# Patient Record
Sex: Male | Born: 1965
Health system: Southern US, Community
[De-identification: ages and names within clinical notes are randomized; demographics above are authoritative.]

## PROBLEM LIST (undated history)

## (undated) DIAGNOSIS — I10 Essential (primary) hypertension: Secondary | ICD-10-CM

## (undated) DIAGNOSIS — G5680 Other specified mononeuropathies of unspecified upper limb: Secondary | ICD-10-CM

## (undated) DIAGNOSIS — G588 Other specified mononeuropathies: Secondary | ICD-10-CM

## (undated) DIAGNOSIS — E785 Hyperlipidemia, unspecified: Secondary | ICD-10-CM

## (undated) DIAGNOSIS — F419 Anxiety disorder, unspecified: Secondary | ICD-10-CM

## (undated) DIAGNOSIS — K469 Unspecified abdominal hernia without obstruction or gangrene: Secondary | ICD-10-CM

## (undated) DIAGNOSIS — Z87442 Personal history of urinary calculi: Secondary | ICD-10-CM

## (undated) HISTORY — PX: LITHOTRIPSY: SUR834

## (undated) HISTORY — DX: Hyperlipidemia, unspecified: E78.5

## (undated) HISTORY — PX: CYSTOSTOMY W/ STENT INSERTION: SHX1434

## (undated) HISTORY — DX: Unspecified abdominal hernia without obstruction or gangrene: K46.9

## (undated) HISTORY — DX: Essential (primary) hypertension: I10

---

## 1997-10-30 ENCOUNTER — Other Ambulatory Visit: Admission: RE | Admit: 1997-10-30 | Discharge: 1997-10-30 | Payer: Self-pay | Admitting: Dermatology

## 2006-10-16 ENCOUNTER — Emergency Department (HOSPITAL_COMMUNITY): Admission: EM | Admit: 2006-10-16 | Discharge: 2006-10-16 | Payer: Self-pay | Admitting: Emergency Medicine

## 2006-10-16 ENCOUNTER — Emergency Department (HOSPITAL_COMMUNITY): Admission: EM | Admit: 2006-10-16 | Discharge: 2006-10-17 | Payer: Self-pay | Admitting: Emergency Medicine

## 2006-10-20 ENCOUNTER — Ambulatory Visit (HOSPITAL_COMMUNITY): Admission: RE | Admit: 2006-10-20 | Discharge: 2006-10-20 | Payer: Self-pay | Admitting: Urology

## 2006-10-24 ENCOUNTER — Ambulatory Visit (HOSPITAL_COMMUNITY): Admission: RE | Admit: 2006-10-24 | Discharge: 2006-10-24 | Payer: Self-pay | Admitting: Urology

## 2009-05-24 HISTORY — PX: HERNIA REPAIR: SHX51

## 2009-12-12 ENCOUNTER — Ambulatory Visit (HOSPITAL_BASED_OUTPATIENT_CLINIC_OR_DEPARTMENT_OTHER): Admission: RE | Admit: 2009-12-12 | Discharge: 2009-12-12 | Payer: Self-pay | Admitting: General Surgery

## 2010-08-08 LAB — BASIC METABOLIC PANEL
Chloride: 102 mEq/L (ref 96–112)
GFR calc Af Amer: >60 mL/min (ref >60)
GFR calc non Af Amer: >60 mL/min (ref >60)
Potassium: 4.6 mEq/L (ref 3.5–5.1)
Sodium: 137 mEq/L (ref 135–145)

## 2010-08-08 LAB — POCT HEMOGLOBIN-HEMACUE: Hemoglobin: 16.4 g/dL (ref 13.0–17.0)

## 2010-10-06 NOTE — H&P (Signed)
NAME:  Edwin Lucas, Edwin Lucas NO.:  [id number]   MEDICAL RECORD NO.:  [id number]          PATIENT TYPE:  EMS   LOCATION:  ED                           FACILITY:  [facility]   PHYSICIAN:  Courtney Paris, M.D.DATE OF BIRTH:  1966/03/21   DATE OF ADMISSION:  10/16/2006  DATE OF DISCHARGE:                              HISTORY & PHYSICAL   HISTORY OF PRESENT ILLNESS:  This 45 year old white male was admitted  with acute right flank pain of early 24 hours in duration.  It woke him  up out of a sleep at 1 a.m. earlier this morning.  It is now nearly  midnight.  He went to the emergency room and then was stabilized with  pain medicine but pain came back and pain meds were doing absolutely no  good.  He has had no previous stones.  KUB shows stone had not changed  from the UPJ where was it located earlier today.  Only operation was  wisdom teeth removed many years ago.   ALLERGIES:  He has no allergies.   MEDICATIONS:  Takes no regular medication.   He saw me about eight years ago for a kidney infection.  but  he had  an x-ray which apparently did not show a stone.   REVIEW OF SYSTEMS:  Twelve-point review of systems otherwise negative.   SOCIAL HISTORY:  He is married, has three children, ages 50 to 70.  He is  self-employed Medical illustrator.  Works out of his home.  He does not smoke.  No  cardiac symptoms.  No pulmonary symptomatology.   PHYSICAL EXAMINATION:  VITAL SIGNS:  Temperature is 98.5, pulse 98,  respirations 18, blood pressure 137/100.  He is a healthy-appearing  white male but in acute pain from right flank discomfort.  HEENT: Clear.  NECK:  Supple.  LUNGS:  Clear.  ABDOMEN:  Soft.  Liver and spleen nonpalpable.  Bladder is not  distended.  GU:  Penis normal, circumcised, adequate meatus, bilaterally descended  testes.  Prostate small, benign.  EXTREMITIES:  Negative.  No edema.  Good distal pulses.  Does have  positive right CVA tenderness.   LABORATORY DATA:   X-ray showed 5 mm stone at the proximal right ureter  as it was earlier today.   IMPRESSION:  1. Obstructing right proximal ureteral stone.  2. Microscopic hematuria.  3. Right flank pain.  Recommend system retrograde and stent placement      tonight as an emergency.      Courtney Paris, M.D.  Electronically Signed     HMK/MEDQ  D:  10/16/2006  T:  10/17/2006  Job:  [id number]

## 2010-10-06 NOTE — Op Note (Signed)
NAME:  Edwin Lucas, Edwin Lucas NO.:  [id number]   MEDICAL RECORD NO.:  [id number]          PATIENT TYPE:  EMS   LOCATION:                               FACILITY:  [facility]   PHYSICIAN:  Courtney Paris, M.D.DATE OF BIRTH:  [birth year]   DATE OF PROCEDURE:  10/17/2006  DATE OF DISCHARGE:                               OPERATIVE REPORT   PREOPERATIVE DIAGNOSIS:  Right ureteropelvic junction stone with  obstruction.   POSTOPERATIVE DIAGNOSIS:  Right ureteropelvic junction stone with  obstruction plus urethral stricture.   PROCEDURE:  1. Cystoscopy.  2. Urethral dilation.  3. Right retrograde pyelogram.  4. Right ureteral stent placement.   ANESTHESIA:  General.   SURGEON:  Courtney Paris, M.D.   BRIEF HISTORY:  This 45 year old patient presents with his first stone  about 24 hours ago.  He has had 2 visits to the emergency room for pain.  The stone has not moved.  It is a 5- to 6-mm stone in the proximal right  ureter.  He enters to have a stent placed at this time for pain relief.   The patient was placed on the operating table in the dorsal lithotomy  position after satisfactory induction of general anesthesia and was  prepped and draped with Betadine in the usual sterile fashion and given  IV Cipro.  The panendoscope was passed down the urethra.  He did have a  circumferential scar in the deep bulbous urethra that was negotiated by  the scope and the prostate was nonobstructing.  The bladder was entered.  The bladder was free of any mucosal lesions.  The right ureteral orifice  was catheterized with a 6 open-ended ureteral catheter, but I had to  place a Sensor guidewire through the end of the catheter to get this  into the small ureteral opening.  When this was done, the guidewire was  removed and an occlusive retrograde demonstrated the stone in the  proximal ureter, with the distal ureter normal.  The stone was  manipulated back into the renal pelvis  with the flushing of the dye.  Next, the Sensor guidewire was then passed back through the open-ended  catheter up to the level of the kidney under fluoroscopy as the open-  ended catheter was then removed.  The patient was 6-feet 2-inches tall,  so I used a 6 x 28-cm-length double-J ureteral stent, but this seemed to  be too long; he has a short trunk and I had to remove the stent and  place the guide wire again and this time a 6 x 26 ureteral stent fit  nicely with a coil in the renal pelvis, with a stone back in the renal  pelvis and one in the bladder in a satisfactory manner.  The bladder was  drained and scope  removed.  The stricture looked adequately dilated.  He was given some  Toradol and B&O suppository for pain relief and was taken to the  recovery room in good condition and will be later discharged as an  outpatient.  He will have lithotripsy later next week.  Courtney Paris, M.D.  Electronically Signed     HMK/MEDQ  D:  10/17/2006  T:  10/17/2006  Job:  [id number]

## 2012-02-16 ENCOUNTER — Encounter (INDEPENDENT_AMBULATORY_CARE_PROVIDER_SITE_OTHER): Payer: Self-pay | Admitting: General Surgery

## 2012-02-16 ENCOUNTER — Ambulatory Visit (INDEPENDENT_AMBULATORY_CARE_PROVIDER_SITE_OTHER): Payer: BC Managed Care – PPO | Admitting: General Surgery

## 2012-02-16 VITALS — BP 130/82 | HR 78 | Temp 98.2°F | Resp 18 | Ht 74.5 in | Wt 273.4 lb

## 2012-02-16 DIAGNOSIS — K439 Ventral hernia without obstruction or gangrene: Secondary | ICD-10-CM | POA: Insufficient documentation

## 2012-02-16 NOTE — Progress Notes (Signed)
Patient ID: Edwin Lucas, male   DOB: January 19, 1966, 46 y.o.   MRN: [id number]  Chief Complaint  Patient presents with  . Hernia    Abdominal wall    HPI Edwin Lucas is a 46 y.o. male.  Possible ventral hernia HPI  Patient status post umbilical hernia repair with mesh in 2011. During his postoperative period, he lifted some heavy laundry in a basket and had some midline pain. He was seen by my partner in the office and was felt to have a subcutaneous mass several centimeters above the umbilicus. His umbilical hernia repair seemed intact at that time. He was thought to possibly have an epigastric hernia versus a subcutaneous mass. It did not bother him at that time. Recently, however, he's been having increasing pain at the site. The mass is there all the time. It never goes in and out. He is not having any changes in bowel or bladder habits. Past Medical History  Diagnosis Date  . Hypertension   . Hernia     abdominal wall    Past Surgical History  Procedure Date  . Hernia repair 2011    umbilical    Family History  Problem Relation Age of Onset  . Cancer Mother     breast  . Heart disease Father     CHF    Social History History  Substance Use Topics  . Smoking status: Former Smoker    Quit date: 02/16/1992  . Smokeless tobacco: Never Used  . Alcohol Use: 1.2 oz/week    2 Glasses of wine per week     every evening    No Known Allergies  Current Outpatient Prescriptions  Medication Sig Dispense Refill  . lisinopril (PRINIVIL,ZESTRIL) 20 MG tablet Daily.        Review of Systems Review of Systems  Constitutional: Negative for fever, chills and unexpected weight change.  HENT: Negative for hearing loss, congestion, sore throat, trouble swallowing and voice change.   Eyes: Negative for visual disturbance.  Respiratory: Negative for cough and wheezing.   Cardiovascular: Negative for chest pain, palpitations and leg swelling.  Gastrointestinal: Positive for  abdominal pain. Negative for nausea, vomiting, diarrhea, constipation, blood in stool, abdominal distention, anal bleeding and rectal pain.       See history of present illness  Genitourinary: Negative for hematuria and difficulty urinating.  Musculoskeletal: Negative for arthralgias.  Skin: Negative for rash and wound.  Neurological: Negative for seizures, syncope, weakness and headaches.  Hematological: Negative for adenopathy. Does not bruise/bleed easily.  Psychiatric/Behavioral: Negative for confusion.    There were no vitals taken for this visit.  Physical Exam Physical Exam  Constitutional: He is oriented to person, place, and time. He appears well-developed and well-nourished.  HENT:  Head: Normocephalic and atraumatic.  Eyes: EOM are normal. Pupils are equal, round, and reactive to light.  Neck: Normal range of motion. Neck supple.  Cardiovascular: Normal rate, regular rhythm and normal heart sounds.   Pulmonary/Chest: Effort normal and breath sounds normal. No respiratory distress. He has no wheezes.  Abdominal: Soft. He exhibits no distension. There is no tenderness. There is no rebound and no guarding.         4-5 cm subcutaneous mass 3 cm cephalad to the umbilicus. It does not reduce like a usual ventral hernia however the location is typical for that. Umbilical hernia repair is intact  Musculoskeletal: Normal range of motion.  Neurological: He is alert and oriented to person, place, and time.  Skin: Skin is warm and dry.    Data Reviewed   Assessment    Possible primary ventral hernia versus subcutaneous abdominal wall mass    Plan    Will check CT scan of the abdomen and pelvis. If this is a ventral hernia, we'll plan laparoscopic repair with mesh. This procedure was discussed in detail with the patient. If it is a subcutaneous mass, we'll also plan excision. It is symptomatic. Will contact patient once we have the results from the scan. I will plan to see him  back next week as well.       Ruby Logiudice E 02/16/2012, 9:49 AM

## 2012-02-17 ENCOUNTER — Other Ambulatory Visit: Payer: Self-pay

## 2012-02-21 ENCOUNTER — Telehealth: Payer: Self-pay | Admitting: General Surgery

## 2012-02-21 ENCOUNTER — Ambulatory Visit
Admission: RE | Admit: 2012-02-21 | Discharge: 2012-02-21 | Disposition: A | Discharge status: Home / Self Care | Payer: BC Managed Care – PPO | Source: Ambulatory Visit | Attending: General Surgery | Admitting: General Surgery

## 2012-02-21 DIAGNOSIS — K439 Ventral hernia without obstruction or gangrene: Secondary | ICD-10-CM

## 2012-02-21 MED ORDER — IOHEXOL 300 MG/ML  SOLN
125.0000 mL | Freq: Once | INTRAMUSCULAR | Status: AC | PRN
  Administered 2012-02-21: 125 mL via INTRAVENOUS

## 2012-02-21 NOTE — Telephone Encounter (Signed)
Called patient to discuss results of his CT.  Left message

## 2012-02-22 ENCOUNTER — Telehealth: Payer: Self-pay | Admitting: General Surgery

## 2012-02-22 ENCOUNTER — Other Ambulatory Visit: Payer: Self-pay | Admitting: General Surgery

## 2012-02-22 NOTE — Telephone Encounter (Signed)
I discussed patient's finding on CT of ventral hernia.  As we spoke in the office, I offered laparoscopic repair with mesh.  We discussed the procedure and its risks and benefits.  He agrees and will call back to schedule.

## 2012-05-12 ENCOUNTER — Encounter (HOSPITAL_COMMUNITY): Admission: RE | Payer: Self-pay | Source: Ambulatory Visit

## 2012-05-12 ENCOUNTER — Ambulatory Visit (HOSPITAL_COMMUNITY): Admission: RE | Admit: 2012-05-12 | Payer: BC Managed Care – PPO | Source: Ambulatory Visit | Admitting: General Surgery

## 2012-05-12 SURGERY — REPAIR, HERNIA, VENTRAL, LAPAROSCOPIC
Anesthesia: General

## 2013-02-21 ENCOUNTER — Ambulatory Visit (INDEPENDENT_AMBULATORY_CARE_PROVIDER_SITE_OTHER): Payer: BC Managed Care – PPO | Admitting: General Surgery

## 2013-02-21 ENCOUNTER — Encounter (INDEPENDENT_AMBULATORY_CARE_PROVIDER_SITE_OTHER): Payer: Self-pay | Admitting: General Surgery

## 2013-02-21 VITALS — BP 123/80 | HR 77 | Temp 98.2°F | Resp 16 | Ht 74.5 in | Wt 274.6 lb

## 2013-02-21 DIAGNOSIS — K439 Ventral hernia without obstruction or gangrene: Secondary | ICD-10-CM

## 2013-02-21 NOTE — Progress Notes (Signed)
Patient ID: Edwin Lucas, male   DOB: 05/23/1966, 47 y.o.   MRN: [id number]  Chief Complaint  Patient presents with  . Hernia    HPI Edwin Lucas is a 47 y.o. male.  Chief complaint: Ventral hernia HPI The patient was scheduled for laparoscopic repair ventral hernia last December. He said cancel at that time due to a variety of reasons. Since then, the hernia has gradually gotten larger. He is not having significant pain but he does have discomfort, especially with activity. He is having no bowel complaints. The hernia seems to go in and out at least partially by itself. Past Medical History  Diagnosis Date  . Hypertension   . Hernia     abdominal wall  . Hyperlipidemia     Past Surgical History  Procedure Laterality Date  . Hernia repair  2011    umbilical    Family History  Problem Relation Age of Onset  . Cancer Mother     breast  . Heart disease Father     CHF    Social History History  Substance Use Topics  . Smoking status: Former Smoker    Quit date: 02/16/1992  . Smokeless tobacco: Never Used  . Alcohol Use: 1.2 oz/week    2 Glasses of wine per week     Comment: every evening    No Known Allergies  Current Outpatient Prescriptions  Medication Sig Dispense Refill  . lisinopril (PRINIVIL,ZESTRIL) 20 MG tablet Daily.       No current facility-administered medications for this visit.    Review of Systems Review of Systems  Constitutional: Negative for fever, chills and unexpected weight change.  HENT: Negative for hearing loss, congestion, sore throat, trouble swallowing and voice change.   Eyes: Negative for visual disturbance.  Respiratory: Negative for cough and wheezing.   Cardiovascular: Negative for chest pain, palpitations and leg swelling.  Gastrointestinal: Negative for nausea, vomiting, abdominal pain, diarrhea, constipation, blood in stool, abdominal distention, anal bleeding and rectal pain.       See history of present illness    Genitourinary: Negative for hematuria and difficulty urinating.  Musculoskeletal: Negative for arthralgias.  Skin: Negative for rash and wound.  Neurological: Negative for seizures, syncope, weakness and headaches.  Hematological: Negative for adenopathy. Does not bruise/bleed easily.  Psychiatric/Behavioral: Negative for confusion.    Blood pressure 123/80, pulse 77, temperature 98.2 F (36.8 C), temperature source Temporal, resp. rate 16, height 6' 2.5" (1.892 m), weight 274 lb 9.6 oz (124.558 kg).  Physical Exam Physical Exam  Constitutional: He is oriented to person, place, and time. He appears well-developed and well-nourished.  HENT:  Head: Normocephalic and atraumatic.  Mouth/Throat: No oropharyngeal exudate.  Eyes: EOM are normal. Pupils are equal, round, and reactive to light. No scleral icterus.  Neck: Normal range of motion. No tracheal deviation present.  Cardiovascular: Normal rate, normal heart sounds and intact distal pulses.   Pulmonary/Chest: Effort normal and breath sounds normal. No stridor. No respiratory distress. He has no wheezes. He has no rales.  Abdominal: Soft. He exhibits no distension. There is no tenderness. There is no rebound.    Supraumbilical ventral hernia partly reduces, no tenderness  Musculoskeletal: He exhibits no edema and no tenderness.  Neurological: He is alert and oriented to person, place, and time.  Skin: Skin is warm and dry.  Psychiatric: He has a normal mood and affect.   Assessment    Symptomatic ventral hernia    Plan  I've offered laparoscopic repair of ventral hernia with mesh. Procedure, risks, and benefits were again discussed with the patient. Rest discussed the possibility of need for conversion to open procedure. He is agreeable. We will plan to schedule this in the near future with overnight observation.       Dariela Stoker E 02/21/2013, 10:53 AM

## 2013-03-09 ENCOUNTER — Ambulatory Visit (HOSPITAL_COMMUNITY)
Admission: RE | Admit: 2013-03-09 | Discharge: 2013-03-09 | Disposition: A | Discharge status: Home / Self Care | Payer: BC Managed Care – PPO | Source: Ambulatory Visit | Attending: Anesthesiology | Admitting: Anesthesiology

## 2013-03-09 ENCOUNTER — Encounter (HOSPITAL_COMMUNITY): Payer: Self-pay

## 2013-03-09 ENCOUNTER — Encounter (HOSPITAL_COMMUNITY)
Admission: RE | Admit: 2013-03-09 | Discharge: 2013-03-09 | Disposition: A | Discharge status: Home / Self Care | Payer: BC Managed Care – PPO | Source: Ambulatory Visit | Attending: General Surgery | Admitting: General Surgery

## 2013-03-09 DIAGNOSIS — Z01818 Encounter for other preprocedural examination: Secondary | ICD-10-CM | POA: Insufficient documentation

## 2013-03-09 DIAGNOSIS — Z01810 Encounter for preprocedural cardiovascular examination: Secondary | ICD-10-CM | POA: Insufficient documentation

## 2013-03-09 DIAGNOSIS — I10 Essential (primary) hypertension: Secondary | ICD-10-CM | POA: Insufficient documentation

## 2013-03-09 DIAGNOSIS — Z01812 Encounter for preprocedural laboratory examination: Secondary | ICD-10-CM | POA: Insufficient documentation

## 2013-03-09 DIAGNOSIS — R9431 Abnormal electrocardiogram [ECG] [EKG]: Secondary | ICD-10-CM | POA: Insufficient documentation

## 2013-03-09 HISTORY — DX: Personal history of urinary calculi: Z87.442

## 2013-03-09 HISTORY — DX: Other specified mononeuropathies of unspecified upper limb: G56.80

## 2013-03-09 HISTORY — DX: Anxiety disorder, unspecified: F41.9

## 2013-03-09 HISTORY — DX: Other specified mononeuropathies: G58.8

## 2013-03-09 LAB — CBC
HCT: 44.7 % (ref 39.0–52.0)
Hemoglobin: 16.1 g/dL (ref 13.0–17.0)
MCHC: 36 g/dL (ref 30.0–36.0)
MCV: 88.9 fL (ref 78.0–100.0)
Platelets: 220 10*3/uL (ref 150–400)
RBC: 5.03 MIL/uL (ref 4.22–5.81)
WBC: 5.7 10*3/uL (ref 4.0–10.5)

## 2013-03-09 LAB — COMPREHENSIVE METABOLIC PANEL
Albumin: 4.2 g/dL (ref 3.5–5.2)
BUN: 12 mg/dL (ref 6–23)
Calcium: 9.9 mg/dL (ref 8.4–10.5)
Creatinine, Ser: 0.82 mg/dL (ref 0.50–1.35)
Total Protein: 7.3 g/dL (ref 6.0–8.3)

## 2013-03-09 NOTE — Pre-Procedure Instructions (Signed)
Edwin Lucas  03/09/2013   Your procedure is scheduled on:  October 23  Report to [facility] Entrance "A" [address] at [employer] AM.  Call this number if you have problems the morning of surgery: [phone]   Remember:   Do not eat food or drink liquids after midnight.   Take these medicines the morning of surgery with A SIP OF WATER: Effexor   STOP Excedrin and Ibuprofen today  Do not take Aspirin, Aleve, Naproxen, Advil, Ibuprofen, Vitamin, Herbs, or Supplements starting today  Do not wear jewelry, make-up or nail polish.  Do not wear lotions, powders, or perfumes. You may wear deodorant.  Do not shave 48 hours prior to surgery. Men may shave face and neck.  Do not bring valuables to the hospital.  [facility] is not responsible                  for any belongings or valuables.               Contacts, dentures or bridgework may not be worn into surgery.  Leave suitcase in the car. After surgery it may be brought to your room.  For patients admitted to the hospital, discharge time is determined by your                treatment team.               Patients discharged the day of surgery will not be allowed to drive  home.  Name and phone number of your driver: Family/ Friend  Special Instructions: Shower using CHG 2 nights before surgery and the night before surgery.  If you shower the day of surgery use CHG.  Use special wash - you have one bottle of CHG for all showers.  You should use approximately 1/3 of the bottle for each shower.   Please read over the following fact sheets that you were given: Pain Booklet, Coughing and Deep Breathing and Surgical Site Infection Prevention

## 2013-03-09 NOTE — Progress Notes (Signed)
03/09/13 1533  OBSTRUCTIVE SLEEP APNEA  Have you ever been diagnosed with sleep apnea through a sleep study? No  Do you snore loudly (loud enough to be heard through closed doors)?  1  Do you often feel tired, fatigued, or sleepy during the daytime? 0  Has anyone observed you stop breathing during your sleep? 1  Do you have, or are you being treated for high blood pressure? 1  BMI more than 35 kg/m2? 0  Age over 47 years old? 0  Neck circumference greater than 40 cm/18 inches? 0  Gender: 1  Obstructive Sleep Apnea Score 4  Score 4 or greater  Results sent to PCP

## 2013-03-13 ENCOUNTER — Encounter (HOSPITAL_COMMUNITY): Payer: Self-pay | Admitting: Pharmacist

## 2013-03-14 MED ORDER — DEXTROSE 5 % IV SOLN
3.0000 g | INTRAVENOUS | Status: AC
  Administered 2013-03-15: 3 g via INTRAVENOUS
  Filled 2013-03-14: qty 3000

## 2013-03-15 ENCOUNTER — Observation Stay (HOSPITAL_COMMUNITY)
Admission: RE | Admit: 2013-03-15 | Discharge: 2013-03-16 | Disposition: A | Discharge status: Home / Self Care | Payer: BC Managed Care – PPO | Source: Ambulatory Visit | Attending: General Surgery | Admitting: General Surgery

## 2013-03-15 ENCOUNTER — Encounter (HOSPITAL_COMMUNITY): Payer: BC Managed Care – PPO | Admitting: Anesthesiology

## 2013-03-15 ENCOUNTER — Encounter (HOSPITAL_COMMUNITY): Payer: Self-pay | Admitting: Anesthesiology

## 2013-03-15 ENCOUNTER — Encounter (HOSPITAL_COMMUNITY)
Admission: RE | Disposition: A | Discharge status: Home / Self Care | Payer: Self-pay | Source: Ambulatory Visit | Attending: General Surgery

## 2013-03-15 ENCOUNTER — Ambulatory Visit (HOSPITAL_COMMUNITY): Payer: BC Managed Care – PPO | Admitting: Anesthesiology

## 2013-03-15 DIAGNOSIS — E785 Hyperlipidemia, unspecified: Secondary | ICD-10-CM | POA: Insufficient documentation

## 2013-03-15 DIAGNOSIS — I10 Essential (primary) hypertension: Secondary | ICD-10-CM | POA: Insufficient documentation

## 2013-03-15 DIAGNOSIS — K439 Ventral hernia without obstruction or gangrene: Principal | ICD-10-CM | POA: Insufficient documentation

## 2013-03-15 HISTORY — PX: INSERTION OF MESH: SHX5868

## 2013-03-15 HISTORY — PX: VENTRAL HERNIA REPAIR: SHX424

## 2013-03-15 SURGERY — REPAIR, HERNIA, VENTRAL, LAPAROSCOPIC
Anesthesia: General | Site: Abdomen | Wound class: Clean

## 2013-03-15 MED ORDER — MIDAZOLAM HCL 5 MG/5ML IJ SOLN
INTRAMUSCULAR | Status: DC | PRN
  Administered 2013-03-15: 2 mg via INTRAVENOUS

## 2013-03-15 MED ORDER — ONDANSETRON HCL 4 MG/2ML IJ SOLN
INTRAMUSCULAR | Status: DC | PRN
  Administered 2013-03-15: 4 mg via INTRAVENOUS

## 2013-03-15 MED ORDER — ATORVASTATIN CALCIUM 20 MG PO TABS
20.0000 mg | ORAL_TABLET | Freq: Every day | ORAL | Status: DC
  Administered 2013-03-15: 20 mg via ORAL
  Filled 2013-03-15 (×2): qty 1

## 2013-03-15 MED ORDER — HEPARIN SODIUM (PORCINE) 5000 UNIT/ML IJ SOLN
5000.0000 [IU] | Freq: Three times a day (TID) | INTRAMUSCULAR | Status: DC
  Administered 2013-03-15 – 2013-03-16 (×2): 5000 [IU] via SUBCUTANEOUS
  Filled 2013-03-15 (×4): qty 1

## 2013-03-15 MED ORDER — GLYCOPYRROLATE 0.2 MG/ML IJ SOLN
INTRAMUSCULAR | Status: DC | PRN
  Administered 2013-03-15: .5 mg via INTRAVENOUS

## 2013-03-15 MED ORDER — LACTATED RINGERS IV SOLN
INTRAVENOUS | Status: DC | PRN
  Administered 2013-03-15 (×3): via INTRAVENOUS

## 2013-03-15 MED ORDER — HYDROCODONE-ACETAMINOPHEN 10-325 MG PO TABS
1.0000 | ORAL_TABLET | ORAL | Status: DC | PRN
  Administered 2013-03-16 (×2): 2 via ORAL
  Filled 2013-03-15 (×2): qty 2

## 2013-03-15 MED ORDER — HYDROMORPHONE HCL PF 1 MG/ML IJ SOLN
0.2500 mg | INTRAMUSCULAR | Status: DC | PRN
  Administered 2013-03-15 (×3): 0.5 mg via INTRAVENOUS

## 2013-03-15 MED ORDER — IBUPROFEN 600 MG PO TABS: 600.0000 mg | ORAL_TABLET | Freq: Three times a day (TID) | ORAL | Status: DC | PRN

## 2013-03-15 MED ORDER — KETOROLAC TROMETHAMINE 30 MG/ML IJ SOLN
15.0000 mg | Freq: Once | INTRAMUSCULAR | Status: AC | PRN
  Administered 2013-03-15: 30 mg via INTRAVENOUS

## 2013-03-15 MED ORDER — ONDANSETRON HCL 4 MG PO TABS: 4.0000 mg | ORAL_TABLET | Freq: Four times a day (QID) | ORAL | Status: DC | PRN

## 2013-03-15 MED ORDER — ACETAMINOPHEN 325 MG PO TABS: 650.0000 mg | ORAL_TABLET | ORAL | Status: DC | PRN

## 2013-03-15 MED ORDER — HYDROMORPHONE HCL PF 1 MG/ML IJ SOLN
INTRAMUSCULAR | Status: AC
  Filled 2013-03-15: qty 1

## 2013-03-15 MED ORDER — LISINOPRIL 20 MG PO TABS
20.0000 mg | ORAL_TABLET | Freq: Every morning | ORAL | Status: DC
  Filled 2013-03-15: qty 1

## 2013-03-15 MED ORDER — VENLAFAXINE HCL ER 150 MG PO CP24
150.0000 mg | ORAL_CAPSULE | Freq: Every day | ORAL | Status: DC
  Filled 2013-03-15: qty 1

## 2013-03-15 MED ORDER — FENTANYL CITRATE 0.05 MG/ML IJ SOLN
INTRAMUSCULAR | Status: DC | PRN
  Administered 2013-03-15 (×2): 50 ug via INTRAVENOUS
  Administered 2013-03-15: 100 ug via INTRAVENOUS
  Administered 2013-03-15: 50 ug via INTRAVENOUS
  Administered 2013-03-15: 100 ug via INTRAVENOUS
  Administered 2013-03-15 (×3): 50 ug via INTRAVENOUS

## 2013-03-15 MED ORDER — LIDOCAINE HCL (CARDIAC) 20 MG/ML IV SOLN
INTRAVENOUS | Status: DC | PRN
  Administered 2013-03-15: 80 mg via INTRAVENOUS

## 2013-03-15 MED ORDER — HYDROMORPHONE HCL PF 1 MG/ML IJ SOLN
1.0000 mg | INTRAMUSCULAR | Status: DC | PRN
  Administered 2013-03-15 (×2): 1 mg via INTRAVENOUS
  Filled 2013-03-15 (×3): qty 1

## 2013-03-15 MED ORDER — NEOSTIGMINE METHYLSULFATE 1 MG/ML IJ SOLN
INTRAMUSCULAR | Status: DC | PRN
  Administered 2013-03-15: 4 mg via INTRAVENOUS

## 2013-03-15 MED ORDER — ROCURONIUM BROMIDE 100 MG/10ML IV SOLN
INTRAVENOUS | Status: DC | PRN
  Administered 2013-03-15: 50 mg via INTRAVENOUS

## 2013-03-15 MED ORDER — PROPOFOL 10 MG/ML IV BOLUS
INTRAVENOUS | Status: DC | PRN
  Administered 2013-03-15: 300 mg via INTRAVENOUS

## 2013-03-15 MED ORDER — ONDANSETRON HCL 4 MG/2ML IJ SOLN: 4.0000 mg | Freq: Once | INTRAMUSCULAR | Status: DC | PRN

## 2013-03-15 MED ORDER — ONDANSETRON HCL 4 MG/2ML IJ SOLN: 4.0000 mg | Freq: Four times a day (QID) | INTRAMUSCULAR | Status: DC | PRN

## 2013-03-15 MED ORDER — BUPIVACAINE-EPINEPHRINE (PF) 0.5% -1:200000 IJ SOLN
INTRAMUSCULAR | Status: AC
  Filled 2013-03-15: qty 10

## 2013-03-15 MED ORDER — KCL IN DEXTROSE-NACL 20-5-0.45 MEQ/L-%-% IV SOLN
INTRAVENOUS | Status: DC
  Administered 2013-03-15: 13:00:00 via INTRAVENOUS
  Administered 2013-03-16: 75 mL/h via INTRAVENOUS
  Filled 2013-03-15 (×3): qty 1000

## 2013-03-15 MED ORDER — KETOROLAC TROMETHAMINE 30 MG/ML IJ SOLN
INTRAMUSCULAR | Status: AC
  Filled 2013-03-15: qty 1

## 2013-03-15 MED ORDER — BUPIVACAINE-EPINEPHRINE 0.5% -1:200000 IJ SOLN
INTRAMUSCULAR | Status: DC | PRN
  Administered 2013-03-15: 19 mL

## 2013-03-15 MED ORDER — ACETAMINOPHEN 10 MG/ML IV SOLN
1000.0000 mg | INTRAVENOUS | Status: AC
  Administered 2013-03-15: 1000 mg via INTRAVENOUS
  Filled 2013-03-15: qty 100

## 2013-03-15 MED ORDER — SODIUM CHLORIDE 0.9 % IR SOLN
Status: DC | PRN
  Administered 2013-03-15: 1

## 2013-03-15 MED ORDER — CHLORHEXIDINE GLUCONATE 4 % EX LIQD: 1.0000 "application " | Freq: Once | CUTANEOUS | Status: DC

## 2013-03-15 MED ORDER — DEXAMETHASONE SODIUM PHOSPHATE 4 MG/ML IJ SOLN
INTRAMUSCULAR | Status: DC | PRN
  Administered 2013-03-15: 4 mg via INTRAVENOUS

## 2013-03-15 MED ORDER — VECURONIUM BROMIDE 10 MG IV SOLR
INTRAVENOUS | Status: DC | PRN
  Administered 2013-03-15 (×2): 1 mg via INTRAVENOUS
  Administered 2013-03-15: 3 mg via INTRAVENOUS
  Administered 2013-03-15 (×2): 2 mg via INTRAVENOUS
  Administered 2013-03-15: 1 mg via INTRAVENOUS

## 2013-03-15 MED ORDER — 0.9 % SODIUM CHLORIDE (POUR BTL) OPTIME
TOPICAL | Status: DC | PRN
  Administered 2013-03-15: 1000 mL

## 2013-03-15 SURGICAL SUPPLY — 56 items
ADH SKN CLS APL DERMABOND .7 (GAUZE/BANDAGES/DRESSINGS) ×1
APPLIER CLIP 5 13 M/L LIGAMAX5 (MISCELLANEOUS)
APPLIER CLIP ROT 10 11.4 M/L (STAPLE)
APR CLP MED LRG 11.4X10 (STAPLE)
APR CLP MED LRG 5 ANG JAW (MISCELLANEOUS)
BINDER ABD UNIV 12 30-45 (MISCELLANEOUS) IMPLANT
BINDER ABDOMINAL 12 (MISCELLANEOUS) ×2
BLADE SURG ROTATE 9660 (MISCELLANEOUS) ×1 IMPLANT
CANISTER SUCTION 2500CC (MISCELLANEOUS) ×2 IMPLANT
CHLORAPREP W/TINT 26ML (MISCELLANEOUS) ×2 IMPLANT
CLIP APPLIE 5 13 M/L LIGAMAX5 (MISCELLANEOUS) IMPLANT
CLIP APPLIE ROT 10 11.4 M/L (STAPLE) IMPLANT
COVER SURGICAL LIGHT HANDLE (MISCELLANEOUS) ×2 IMPLANT
DERMABOND ADVANCED (GAUZE/BANDAGES/DRESSINGS) ×1
DERMABOND ADVANCED .7 DNX12 (GAUZE/BANDAGES/DRESSINGS) ×1 IMPLANT
DEVICE SECURE STRAP 25 ABSORB (INSTRUMENTS) ×2 IMPLANT
DEVICE TROCAR PUNCTURE CLOSURE (ENDOMECHANICALS) ×2 IMPLANT
DRAPE UTILITY 15X26 W/TAPE STR (DRAPE) ×4 IMPLANT
ELECT REM PT RETURN 9FT ADLT (ELECTROSURGICAL) ×2
ELECTRODE REM PT RTRN 9FT ADLT (ELECTROSURGICAL) ×1 IMPLANT
FILTER SMOKE EVAC LAPAROSHD (FILTER) ×1 IMPLANT
GLOVE BIO SURGEON STRL SZ7 (GLOVE) ×2 IMPLANT
GLOVE BIO SURGEON STRL SZ8 (GLOVE) ×2 IMPLANT
GLOVE BIOGEL PI IND STRL 7.0 (GLOVE) IMPLANT
GLOVE BIOGEL PI IND STRL 8 (GLOVE) ×1 IMPLANT
GLOVE BIOGEL PI INDICATOR 7.0 (GLOVE) ×1
GLOVE BIOGEL PI INDICATOR 8 (GLOVE) ×1
GLOVE SURG SS PI 6.5 STRL IVOR (GLOVE) ×1 IMPLANT
GLOVE SURG SS PI 7.0 STRL IVOR (GLOVE) ×1 IMPLANT
GOWN STRL NON-REIN LRG LVL3 (GOWN DISPOSABLE) ×4 IMPLANT
GOWN STRL REIN XL XLG (GOWN DISPOSABLE) ×2 IMPLANT
KIT BASIN OR (CUSTOM PROCEDURE TRAY) ×2 IMPLANT
KIT ROOM TURNOVER OR (KITS) ×2 IMPLANT
MARKER SKIN DUAL TIP RULER LAB (MISCELLANEOUS) ×2 IMPLANT
MESH VENTRALIGHT ST 8X10 (Mesh General) ×1 IMPLANT
NDL SPNL 22GX3.5 QUINCKE BK (NEEDLE) ×1 IMPLANT
NEEDLE 22X1 1/2 (OR ONLY) (NEEDLE) ×2 IMPLANT
NEEDLE SPNL 22GX3.5 QUINCKE BK (NEEDLE) ×2 IMPLANT
NS IRRIG 1000ML POUR BTL (IV SOLUTION) ×2 IMPLANT
PAD ARMBOARD 7.5X6 YLW CONV (MISCELLANEOUS) ×4 IMPLANT
SCALPEL HARMONIC ACE (MISCELLANEOUS) ×1 IMPLANT
SCISSORS LAP 5X35 DISP (ENDOMECHANICALS) ×2 IMPLANT
SET IRRIG TUBING LAPAROSCOPIC (IRRIGATION / IRRIGATOR) ×2 IMPLANT
SLEEVE ENDOPATH XCEL 5M (ENDOMECHANICALS) ×3 IMPLANT
SUT PROLENE 0 CT 1 CR/8 (SUTURE) ×2 IMPLANT
SUT VIC AB 4-0 PS2 27 (SUTURE) ×2 IMPLANT
SUT VICRYL 0 TIES 12 18 (SUTURE) IMPLANT
TOWEL OR 17X24 6PK STRL BLUE (TOWEL DISPOSABLE) ×2 IMPLANT
TOWEL OR 17X26 10 PK STRL BLUE (TOWEL DISPOSABLE) ×2 IMPLANT
TRAY FOLEY CATH 14FRSI W/METER (CATHETERS) ×1 IMPLANT
TRAY FOLEY CATH 16FRSI W/METER (SET/KITS/TRAYS/PACK) ×1 IMPLANT
TRAY LAPAROSCOPIC (CUSTOM PROCEDURE TRAY) ×2 IMPLANT
TROCAR XCEL BLUNT TIP 100MML (ENDOMECHANICALS) IMPLANT
TROCAR XCEL NON-BLD 11X100MML (ENDOMECHANICALS) ×2 IMPLANT
TROCAR XCEL NON-BLD 5MMX100MML (ENDOMECHANICALS) ×2 IMPLANT
WATER STERILE IRR 1000ML POUR (IV SOLUTION) IMPLANT

## 2013-03-15 NOTE — Anesthesia Preprocedure Evaluation (Addendum)
Anesthesia Evaluation  Patient identified by MRN, date of birth, ID band Patient awake    Reviewed: Allergy & Precautions, H&P , NPO status , Patient's Chart, lab work & pertinent test results  Airway Mallampati: II      Dental  (+) Teeth Intact and Dental Advisory Given   Pulmonary  breath sounds clear to auscultation        Cardiovascular Rhythm:Regular Rate:Normal     Neuro/Psych    GI/Hepatic   Endo/Other    Renal/GU      Musculoskeletal   Abdominal (+) + obese,   Peds  Hematology   Anesthesia Other Findings   Reproductive/Obstetrics                           Anesthesia Physical Anesthesia Plan  ASA: II  Anesthesia Plan: General   Post-op Pain Management:    Induction: Intravenous  Airway Management Planned: Oral ETT  Additional Equipment:   Intra-op Plan:   Post-operative Plan: Extubation in OR  Informed Consent: I have reviewed the patients History and Physical, chart, labs and discussed the procedure including the risks, benefits and alternatives for the proposed anesthesia with the patient or authorized representative who has indicated his/her understanding and acceptance.   Dental advisory given  Plan Discussed with: CRNA and Anesthesiologist  Anesthesia Plan Comments: (Ventral hernia Hypertension Obesity  Plan GA with ETT  Kipp Brood, MD)        Anesthesia Quick Evaluation

## 2013-03-15 NOTE — Anesthesia Procedure Notes (Signed)
Procedure Name: Intubation Date/Time: 03/15/2013 7:39 AM Performed by: Lovie Chol Pre-anesthesia Checklist: Patient identified, Emergency Drugs available, Suction available, Patient being monitored and Timeout performed Patient Re-evaluated:Patient Re-evaluated prior to inductionOxygen Delivery Method: Circle system utilized Preoxygenation: Pre-oxygenation with 100% oxygen Intubation Type: IV induction Ventilation: Mask ventilation without difficulty Laryngoscope Size: Miller and 3 Grade View: Grade I Tube type: Oral Tube size: 8.0 mm Number of attempts: 1 Airway Equipment and Method: Stylet Placement Confirmation: ETT inserted through vocal cords under direct vision,  positive ETCO2,  CO2 detector and breath sounds checked- equal and bilateral Secured at: 23 cm Tube secured with: Tape Dental Injury: Teeth and Oropharynx as per pre-operative assessment

## 2013-03-15 NOTE — H&P (View-Only) (Signed)
Patient ID: Edwin Lucas, male   DOB: 08/19/1965, 47 y.o.   MRN: [id number]  Chief Complaint  Patient presents with  . Hernia    HPI Haward C Carawan is a 47 y.o. male.  Chief complaint: Ventral hernia HPI The patient was scheduled for laparoscopic repair ventral hernia last December. He said cancel at that time due to a variety of reasons. Since then, the hernia has gradually gotten larger. He is not having significant pain but he does have discomfort, especially with activity. He is having no bowel complaints. The hernia seems to go in and out at least partially by itself. Past Medical History  Diagnosis Date  . Hypertension   . Hernia     abdominal wall  . Hyperlipidemia     Past Surgical History  Procedure Laterality Date  . Hernia repair  2011    umbilical    Family History  Problem Relation Age of Onset  . Cancer Mother     breast  . Heart disease Father     CHF    Social History History  Substance Use Topics  . Smoking status: Former Smoker    Quit date: 02/16/1992  . Smokeless tobacco: Never Used  . Alcohol Use: 1.2 oz/week    2 Glasses of wine per week     Comment: every evening    No Known Allergies  Current Outpatient Prescriptions  Medication Sig Dispense Refill  . lisinopril (PRINIVIL,ZESTRIL) 20 MG tablet Daily.       No current facility-administered medications for this visit.    Review of Systems Review of Systems  Constitutional: Negative for fever, chills and unexpected weight change.  HENT: Negative for hearing loss, congestion, sore throat, trouble swallowing and voice change.   Eyes: Negative for visual disturbance.  Respiratory: Negative for cough and wheezing.   Cardiovascular: Negative for chest pain, palpitations and leg swelling.  Gastrointestinal: Negative for nausea, vomiting, abdominal pain, diarrhea, constipation, blood in stool, abdominal distention, anal bleeding and rectal pain.       See history of present illness    Genitourinary: Negative for hematuria and difficulty urinating.  Musculoskeletal: Negative for arthralgias.  Skin: Negative for rash and wound.  Neurological: Negative for seizures, syncope, weakness and headaches.  Hematological: Negative for adenopathy. Does not bruise/bleed easily.  Psychiatric/Behavioral: Negative for confusion.    Blood pressure 123/80, pulse 77, temperature 98.2 F (36.8 C), temperature source Temporal, resp. rate 16, height 6' 2.5" (1.892 m), weight 274 lb 9.6 oz (124.558 kg).  Physical Exam Physical Exam  Constitutional: He is oriented to person, place, and time. He appears well-developed and well-nourished.  HENT:  Head: Normocephalic and atraumatic.  Mouth/Throat: No oropharyngeal exudate.  Eyes: EOM are normal. Pupils are equal, round, and reactive to light. No scleral icterus.  Neck: Normal range of motion. No tracheal deviation present.  Cardiovascular: Normal rate, normal heart sounds and intact distal pulses.   Pulmonary/Chest: Effort normal and breath sounds normal. No stridor. No respiratory distress. He has no wheezes. He has no rales.  Abdominal: Soft. He exhibits no distension. There is no tenderness. There is no rebound.    Supraumbilical ventral hernia partly reduces, no tenderness  Musculoskeletal: He exhibits no edema and no tenderness.  Neurological: He is alert and oriented to person, place, and time.  Skin: Skin is warm and dry.  Psychiatric: He has a normal mood and affect.   Assessment    Symptomatic ventral hernia    Plan      I've offered laparoscopic repair of ventral hernia with mesh. Procedure, risks, and benefits were again discussed with the patient. Rest discussed the possibility of need for conversion to open procedure. He is agreeable. We will plan to schedule this in the near future with overnight observation.       Rahim Astorga E 02/21/2013, 10:53 AM    

## 2013-03-15 NOTE — Op Note (Signed)
03/15/2013  9:28 AM  PATIENT:  Edwin Lucas  47 y.o. male  PRE-OPERATIVE DIAGNOSIS:  ventral hernia  POST-OPERATIVE DIAGNOSIS:  ventral hernia  PROCEDURE:  Procedure(s): LAPAROSCOPIC VENTRAL HERNIA INSERTION OF MESH  SURGEON:  Surgeon(s): Liz Malady, MD  PHYSICIAN ASSISTANT:   ASSISTANTS: none   ANESTHESIA:   local and general  EBL:  Total I/O In: 2000 [I.V.:2000] Out: 250 [Urine:200; Blood:50]  BLOOD ADMINISTERED:none  DRAINS: none   SPECIMEN:  No Specimen  DISPOSITION OF SPECIMEN:  N/A  COUNTS:  YES  DICTATION: .Dragon DictationPatient presents for laparoscopic repair of ventral hernia with mesh. He was identified in the preop holding area. He received intravenous antibiotics. Informed consent was obtained. He was brought to the operating room and general endotracheal anesthesia was administered by the anesthesia staff. Foley catheter was placed by nursing. His abdomen was prepped and draped in a sterile fashion. We did a time out procedure. An area on his left costal margin was infiltrated with local. Small incision was made. 5 mm port was placed the Optiview technique. This was very straightforward. The abdomen was insufflated with carbon dioxide. The area of Optiview insertion was inspected and there were no complete features noted. No bleeding. Under direct vision, a left lower quadrant 12 mm port, a 5 mm right lower quadrant port, and a 5 mm right upper quadrant port were placed. Local was used at each port site. The hernia was inspected. It contained omentum. The omentum was gently reduced. The last bit was adherent to the sac and this was taken down using Harmonic. Next hemostasis was ensured of the omentum. The falciform ligament was divided with the harmonic at its insertion and divided back to keep it out of the way of our planned mesh repair. Next I measured out and the borders of the defect which was a few centimeters cephalad to the umbilicus. His  previous umbilical hernia repair was intact. Decision was made, however, to cover the umbilical area as well with mesh. This area was measured out and allowing for at least a 4 cm further overlap, a 25 x 20 cm Versalight mesh Was chosen. 6 0 Prolenes were placed Around the circumference of the mesh. It was dipped in saline and inserted into the abdomen. It was oriented appropriately. Starting with the cephalad portion, a small stab wound was made in the Endo Catch was used with 2 separate passes to grasp the sutures. This technique was done with the other 5 sutures. Next all of the sutures were pulled up tight against the abdominal wall and the camera was reinserted. The mesh was laying nicely. Sutures were all tied. Next secure strap tacks were placed and 2 concentric rings attached in the mesh to the abdominal wall. It laid nicely and flat. The abdomen was then inspected in 4 quadrants. There was no further bleeding. The mesh was intact. Pneumoperitoneum was released. Ports were removed. Port sites were closed with running 4-0 Vicryl subcuticular followed by Dermabond. The sutures tablets were also closed with Dermabond. All counts were correct. An abdominal binder was placed after all Dermabond was dry. Patient tolerated the procedure well without apparent complication and was taken recovery in stable condition.  PATIENT DISPOSITION:  PACU - hemodynamically stable.   Delay start of Pharmacological VTE agent (>24hrs) due to surgical blood loss or risk of bleeding:  not applicable  Violeta Gelinas, MD, MPH, FACS Pager: [phone]  10/23/20149:28 AM

## 2013-03-15 NOTE — Preoperative (Signed)
Beta Blockers   Reason not to administer Beta Blockers:Not Applicable 

## 2013-03-15 NOTE — Transfer of Care (Signed)
Immediate Anesthesia Transfer of Care Note  Patient: Edwin Lucas  Procedure(s) Performed: Procedure(s): LAPAROSCOPIC VENTRAL HERNIA (N/A) INSERTION OF MESH (N/A)  Patient Location: PACU  Anesthesia Type:General  Level of Consciousness: awake, alert , oriented and patient cooperative  Airway & Oxygen Therapy: Patient Spontanous Breathing and Patient connected to nasal cannula oxygen  Post-op Assessment: Report given to PACU RN and Post -op Vital signs reviewed and stable  Post vital signs: Reviewed  Complications: No apparent anesthesia complications

## 2013-03-15 NOTE — Anesthesia Postprocedure Evaluation (Signed)
  Anesthesia Post-op Note  Patient: Edwin Lucas  Procedure(s) Performed: Procedure(s): LAPAROSCOPIC VENTRAL HERNIA (N/A) INSERTION OF MESH (N/A)  Patient Location: PACU  Anesthesia Type:General  Level of Consciousness: awake, alert  and oriented  Airway and Oxygen Therapy: Patient Spontanous Breathing and Patient connected to nasal cannula oxygen  Post-op Pain: mild  Post-op Assessment: Post-op Vital signs reviewed, Patient's Cardiovascular Status Stable, Respiratory Function Stable, Patent Airway and Pain level controlled  Post-op Vital Signs: stable  Complications: No apparent anesthesia complications

## 2013-03-15 NOTE — Interval H&P Note (Signed)
History and Physical Interval Note:  03/15/2013 6:48 AM  Edwin Lucas  has presented today for surgery, with the diagnosis of ventral hernia  The various methods of treatment have been discussed with the patient and family. After consideration of risks, benefits and other options for treatment, the patient has consented to  Procedure(s): LAPAROSCOPIC VENTRAL HERNIA (N/A) INSERTION OF MESH (N/A) as a surgical intervention .  The patient's history has been reviewed, patient re-examined, no change in status, stable for surgery.  I have reviewed the patient's chart and labs.  Questions were answered to the patient's satisfaction.     Unique Searfoss E

## 2013-03-16 ENCOUNTER — Encounter (HOSPITAL_COMMUNITY): Payer: Self-pay | Admitting: General Surgery

## 2013-03-16 MED ORDER — ONDANSETRON HCL 4 MG PO TABS: 4.0000 mg | ORAL_TABLET | Freq: Four times a day (QID) | ORAL | Status: DC | PRN

## 2013-03-16 MED ORDER — HYDROCODONE-ACETAMINOPHEN 10-325 MG PO TABS: 1.0000 | ORAL_TABLET | Freq: Four times a day (QID) | ORAL | Status: DC | PRN

## 2013-03-16 NOTE — Progress Notes (Signed)
DC instructions gone over with patient, questions answered, verbalized understanding.  Rx given for pain medication.  Patient transported to front of hospital via wheelchair to be taken home by wife.

## 2013-03-16 NOTE — Discharge Summary (Signed)
Physician Discharge Summary  Patient ID: Edwin Lucas MRN: [id number] DOB/AGE: [birth year] 47 y.o.  Admit date: 03/15/2013 Discharge date: 03/16/2013  Admission Diagnoses:Ventral hernia  Discharge Diagnoses: Status post laparoscopic repair of ventral hernia with mesh Active Problems:   * No active hospital problems. *   Discharged Condition: good  Hospital Course: Patient underwent laparoscopic repair of ventral hernia with mesh. Postoperatively, he had good pain control. He tolerated advancement of his diet. He remained afebrile and hemodynamically stable. He gradually mobilized and transition to oral pain medication. He is discharged on postoperative day one.  Consults: None  Significant Diagnostic Studies: N/A  Treatments: surgery: above  Discharge Exam: Blood pressure 126/81, pulse 77, temperature 98 F (36.7 C), temperature source Oral, resp. rate 20, height 6' 2.5" (1.892 m), weight 123.378 kg (272 lb), SpO2 95.00%. General appearance: alert and cooperative Resp: clear to auscultation bilaterally Cardio: regular rate and rhythm GI: soft, incisions intact, positive bowel sounds, binder were placed, expected soreness but no generalized tenderness  Disposition: Final discharge disposition not confirmed  Discharge Orders   Future Appointments Provider Department Dept Phone   03/28/2013 11:10 AM Edwin Malady, MD [facility], [place] [phone]   Future Orders Complete By Expires   Diet - low sodium heart healthy  As directed    Discharge instructions  As directed    Comments:     [facility] _______[facility]  UMBILICAL OR INGUINAL HERNIA REPAIR: POST OP INSTRUCTIONS  Always review your discharge instruction sheet given to you by the facility where your surgery was performed. IF YOU HAVE DISABILITY OR FAMILY LEAVE FORMS, YOU MUST BRING THEM TO THE OFFICE FOR PROCESSING.   DO NOT GIVE THEM TO YOUR DOCTOR.  A  prescription for pain medication  may be given to you upon discharge.  Take your pain medication as prescribed, if needed.  If narcotic pain medicine is not needed, then you may take acetaminophen (Tylenol) or ibuprofen (Advil) as needed. Take your usually prescribed medications unless otherwise directed. If you need a refill on your pain medication, please contact your pharmacy.  They will contact our office to request authorization. Prescriptions will not be filled after 5 pm or on week-ends. You should follow a light diet the first 24 hours after arrival home, such as soup and crackers, etc.  Be sure to include lots of fluids daily.  Resume your normal diet the day after surgery. Most patients will experience some swelling and bruising around the umbilicus or in the groin and scrotum.  Ice packs and reclining will help.  Swelling and bruising can take several days to resolve.  It is common to experience some constipation if taking pain medication after surgery.  Increasing fluid intake and taking a stool softener (such as Colace) will usually help or prevent this problem from occurring.  A mild laxative (Milk of Magnesia or Miralax) should be taken according to package directions if there are no bowel movements after 48 hours. Unless discharge instructions indicate otherwise, you may remove your bandages 24-48 hours after surgery, and you may shower at that time.  You may have steri-strips (small skin tapes) in place directly over the incision.  These strips should be left on the skin for 7-10 days.  If your surgeon used skin glue on the incision, you may shower in 24 hours.  The glue will flake off over the next 2-3 weeks.  Any sutures or staples will be removed at the office during your follow-up  visit. ACTIVITIES:  You may resume regular (light) daily activities beginning the next day-such as daily self-care, walking, climbing stairs-gradually increasing activities as tolerated.  You may have sexual intercourse when it is comfortable.   Refrain from any heavy lifting or straining until approved by your doctor. You may drive when you are no longer taking prescription pain medication, you can comfortably wear a seatbelt, and you can safely maneuver your car and apply brakes. RETURN TO WORK:  __________________________________________________________ Edwin Lucas should see your doctor in the office for a follow-up appointment approximately 2-3 weeks after your surgery.  Make sure that you call for this appointment within a day or two after you arrive home to insure a convenient appointment time. OTHER INSTRUCTIONS:  __________________________________________________________________________________________________________________________________________________________________________________________  WHEN TO CALL YOUR DOCTOR: Fever over 101.0 Inability to urinate Nausea and/or vomiting Extreme swelling or bruising Continued bleeding from incision. Increased pain, redness, or drainage from the incision  The clinic staff is available to answer your questions during regular business hours.  Please don't hesitate to call and ask to speak to one of the nurses for clinical concerns.  If you have a medical emergency, go to the nearest emergency room or call 911.  A surgeon from [facility] is always on call at the hospital   [address], [address], [place], [place]  [address] ?  [address] [address], [place], [place]   [address] [phone] ? [phone] ? FAX [phone] Web site: [email or web address]   Discharge wound care:  As directed    Comments:     No dressings needed. He may shower. Wear abdominal binder as much as tolerated, especially for the next 2 weeks.   Increase activity slowly  As directed    Lifting restrictions  As directed    Comments:     Do not lift over 10 pounds for 6 weeks       Medication List    STOP taking these medications       atorvastatin 20 MG tablet  Commonly known as:   LIPITOR     lisinopril 20 MG tablet  Commonly known as:  PRINIVIL,ZESTRIL     venlafaxine XR 150 MG 24 hr capsule  Commonly known as:  EFFEXOR-XR      TAKE these medications       EXCEDRIN PO  Take 2 tablets by mouth daily as needed (headache).     HYDROcodone-acetaminophen 10-325 MG per tablet  Commonly known as:  NORCO  Take 1-2 tablets by mouth every 6 (six) hours as needed for pain.     ibuprofen 200 MG tablet  Commonly known as:  ADVIL,MOTRIN  Take 600 mg by mouth 2 (two) times daily as needed for pain.     ondansetron 4 MG tablet  Commonly known as:  ZOFRAN  Take 1 tablet (4 mg total) by mouth every 6 (six) hours as needed for nausea.       CONTINUE ALL HOME MEDICATIONS INCLUDING LIPITOR, LISINOPRIL, AND EFFEXOR - ABOVE IS AN ERROR  Signed: Yasmene Salomone E 03/16/2013, 8:01 AM

## 2013-03-28 ENCOUNTER — Encounter (INDEPENDENT_AMBULATORY_CARE_PROVIDER_SITE_OTHER): Payer: Self-pay | Admitting: General Surgery

## 2013-03-28 ENCOUNTER — Ambulatory Visit (INDEPENDENT_AMBULATORY_CARE_PROVIDER_SITE_OTHER): Payer: BC Managed Care – PPO | Admitting: General Surgery

## 2013-03-28 VITALS — BP 152/78 | HR 80 | Resp 16 | Ht 74.5 in | Wt 265.0 lb

## 2013-03-28 DIAGNOSIS — K439 Ventral hernia without obstruction or gangrene: Secondary | ICD-10-CM

## 2013-03-28 NOTE — Progress Notes (Signed)
Subjective:     Patient ID: Edwin Lucas, male   DOB: 1966-03-02, 47 y.o.   MRN: [id number]  HPI Patient is status post laparoscopic ventral hernia repair with mesh. He is no longer taking pain medication. He is back working from home. He is avoiding any heavy lifting whatsoever. He's noticed some swelling centrally. No other complaints.  Review of Systems     Objective:   Physical Exam Patient has a seroma over the original hernia defect without evidence of infection. All incisions are well-healed. There is no tenderness.    Assessment:     Status post laparoscopic ventral hernia repair with mesh, postoperative seroma    Plan:     Continue wearing the binder when necessary, I will see him back in a few weeks to reevaluate his seroma. I would not drain it at this time. It should resolve on its on.

## 2013-05-02 ENCOUNTER — Ambulatory Visit (INDEPENDENT_AMBULATORY_CARE_PROVIDER_SITE_OTHER): Payer: BC Managed Care – PPO | Admitting: General Surgery

## 2013-05-02 ENCOUNTER — Encounter (INDEPENDENT_AMBULATORY_CARE_PROVIDER_SITE_OTHER): Payer: Self-pay | Admitting: General Surgery

## 2013-05-02 VITALS — BP 120/82 | HR 72 | Temp 97.0°F | Resp 18 | Wt 266.5 lb

## 2013-05-02 DIAGNOSIS — IMO0001 Reserved for inherently not codable concepts without codable children: Secondary | ICD-10-CM

## 2013-05-02 DIAGNOSIS — K439 Ventral hernia without obstruction or gangrene: Secondary | ICD-10-CM

## 2013-05-02 DIAGNOSIS — Z5189 Encounter for other specified aftercare: Secondary | ICD-10-CM

## 2013-05-02 DIAGNOSIS — IMO0002 Reserved for concepts with insufficient information to code with codable children: Secondary | ICD-10-CM | POA: Insufficient documentation

## 2013-05-02 NOTE — Progress Notes (Signed)
Subjective:     Patient ID: Edwin Lucas, male   DOB: 05-Nov-1965, 47 y.o.   MRN: [id number]  HPI Patient presents status post laparoscopic repair of ventral hernia. He had a postoperative seroma. He claims this has gotten significantly smaller. He's not having any pain. He is 7 weeks out from surgery.  Review of Systems     Objective:   Physical Exam Abdomen soft and nontender. Seroma is significantly smaller, down to about 3-4 cm.Hernia repair feels intact. No evidence of infection.    Assessment:     Resolving seroma, doing well status post laparoscopic repair ventral hernia    Plan:     Gradually return to  exercise activities, let me know if seroma does not completely resolve.

## 2014-06-18 ENCOUNTER — Emergency Department (HOSPITAL_COMMUNITY)
Admission: EM | Admit: 2014-06-18 | Discharge: 2014-06-18 | Disposition: A | Discharge status: Home / Self Care | Payer: BLUE CROSS/BLUE SHIELD | Attending: Emergency Medicine | Admitting: Emergency Medicine

## 2014-06-18 ENCOUNTER — Encounter (HOSPITAL_COMMUNITY): Payer: Self-pay | Admitting: Emergency Medicine

## 2014-06-18 ENCOUNTER — Emergency Department (HOSPITAL_COMMUNITY): Payer: BLUE CROSS/BLUE SHIELD

## 2014-06-18 DIAGNOSIS — Y9289 Other specified places as the place of occurrence of the external cause: Secondary | ICD-10-CM | POA: Insufficient documentation

## 2014-06-18 DIAGNOSIS — S4992XA Unspecified injury of left shoulder and upper arm, initial encounter: Secondary | ICD-10-CM | POA: Diagnosis not present

## 2014-06-18 DIAGNOSIS — Y998 Other external cause status: Secondary | ICD-10-CM | POA: Diagnosis not present

## 2014-06-18 DIAGNOSIS — W000XXA Fall on same level due to ice and snow, initial encounter: Secondary | ICD-10-CM | POA: Diagnosis not present

## 2014-06-18 DIAGNOSIS — Z87442 Personal history of urinary calculi: Secondary | ICD-10-CM | POA: Insufficient documentation

## 2014-06-18 DIAGNOSIS — Z8659 Personal history of other mental and behavioral disorders: Secondary | ICD-10-CM | POA: Diagnosis not present

## 2014-06-18 DIAGNOSIS — Z79899 Other long term (current) drug therapy: Secondary | ICD-10-CM | POA: Diagnosis not present

## 2014-06-18 DIAGNOSIS — Z8719 Personal history of other diseases of the digestive system: Secondary | ICD-10-CM | POA: Diagnosis not present

## 2014-06-18 DIAGNOSIS — Y9301 Activity, walking, marching and hiking: Secondary | ICD-10-CM | POA: Insufficient documentation

## 2014-06-18 DIAGNOSIS — S0003XA Contusion of scalp, initial encounter: Secondary | ICD-10-CM | POA: Insufficient documentation

## 2014-06-18 DIAGNOSIS — Z8669 Personal history of other diseases of the nervous system and sense organs: Secondary | ICD-10-CM | POA: Diagnosis not present

## 2014-06-18 DIAGNOSIS — S199XXA Unspecified injury of neck, initial encounter: Secondary | ICD-10-CM | POA: Insufficient documentation

## 2014-06-18 DIAGNOSIS — R410 Disorientation, unspecified: Secondary | ICD-10-CM | POA: Insufficient documentation

## 2014-06-18 DIAGNOSIS — Z87891 Personal history of nicotine dependence: Secondary | ICD-10-CM | POA: Insufficient documentation

## 2014-06-18 DIAGNOSIS — S0990XA Unspecified injury of head, initial encounter: Secondary | ICD-10-CM | POA: Diagnosis present

## 2014-06-18 DIAGNOSIS — E785 Hyperlipidemia, unspecified: Secondary | ICD-10-CM | POA: Insufficient documentation

## 2014-06-18 DIAGNOSIS — I10 Essential (primary) hypertension: Secondary | ICD-10-CM | POA: Insufficient documentation

## 2014-06-18 MED ORDER — NAPROXEN 500 MG PO TABS: 500.0000 mg | ORAL_TABLET | Freq: Two times a day (BID) | ORAL | Status: DC

## 2014-06-18 MED ORDER — METHOCARBAMOL 500 MG PO TABS: 1000.0000 mg | ORAL_TABLET | Freq: Four times a day (QID) | ORAL | Status: DC

## 2014-06-18 NOTE — ED Notes (Signed)
Pt. slipped on ice and fell , hit his head with no LOC , spouse reports brief confusion , complained of headache /posterior neck pain . Pt. Is alert and disoriented to time . C- collar applied at triage .

## 2014-06-18 NOTE — Discharge Instructions (Signed)
Please read and follow all provided instructions.  Your diagnoses today include:  1. Head injury, initial encounter     Tests performed today include:  CT scan of your head and neck did not show any serious injury. It is unclear if you suffered a concussion at this time. Monitor for symptoms and follow-up with your primary care doctor as needed.   Vital signs. See below for your results today.   Medications prescribed:   Robaxin (methocarbamol) - muscle relaxer medication  DO NOT drive or perform any activities that require you to be awake and alert because this medicine can make you drowsy.    Naproxen - anti-inflammatory pain medication  Do not exceed 500mg  naproxen every 12 hours, take with food  You have been prescribed an anti-inflammatory medication or NSAID. Take with food. Take smallest effective dose for the shortest duration needed for your pain. Stop taking if you experience stomach pain or vomiting.   Take any prescribed medications only as directed.  Home care instructions:  Follow any educational materials contained in this packet.  BE VERY CAREFUL not to take multiple medicines containing Tylenol (also called acetaminophen). Doing so can lead to an overdose which can damage your liver and cause liver failure and possibly death.   Follow-up instructions: Please follow-up with your primary care provider in the next 2 days for further evaluation if you have any persistent symptoms.   Return instructions:  SEEK IMMEDIATE MEDICAL ATTENTION IF:  There is confusion or drowsiness (although children frequently become drowsy after injury).   You cannot awaken the injured person.   You have more than one episode of vomiting.   You notice dizziness or unsteadiness which is getting worse, or inability to walk.   You have convulsions or unconsciousness.   You experience severe, persistent headaches not relieved by Tylenol.  You cannot use arms or legs normally.    There are changes in pupil sizes. (This is the black center in the colored part of the eye)   There is clear or bloody discharge from the nose or ears.   You have change in speech, vision, swallowing, or understanding.   Localized weakness, numbness, tingling, or change in bowel or bladder control.  You have any other emergent concerns.  Additional Information: You have had a head injury which does not appear to require admission at this time.  Your vital signs today were: BP 155/94 mmHg   Pulse 65   Temp(Src) 98.2 F (36.8 C)   Resp 20   Wt 263 lb 1 oz (119.324 kg)   SpO2 100% If your blood pressure (BP) was elevated above 135/85 this visit, please have this repeated by your doctor within one month. --------------

## 2014-06-18 NOTE — ED Notes (Signed)
Patient transported to CT 

## 2014-06-18 NOTE — ED Provider Notes (Signed)
CSN: [id number]     Arrival date & time 06/18/14  1902 History   First MD Initiated Contact with Patient 06/18/14 2149     Chief Complaint  Patient presents with  . Fall     (Consider location/radiation/quality/duration/timing/severity/associated sxs/prior Treatment) HPI Comments: Patient presents with chief complaint of head injury. At approximally 6:15 PM today patient walked outside and slipped on ice. He states his hands were in his pocket and he fell backwards falling directly onto the back of his head. He denies loss of consciousness but was disoriented and confused. He was able to call his wife on the phone. Wife reports retrograde amnesia to the event. She called their primary care doctor and was advised to come to the emergency department for evaluation. Patient has had a minor headache. He has left shoulder soreness. No vision change. He has had posterior neck pain. No weakness, numbness, or tingling in his arms or legs. No nausea or vomiting. He was ambulatory without difficulty. No treatments prior to arrival other than c-collar which was placed at arrival to the emergency department. No chest or abdominal pain. Onset of symptoms acute. Course is improving. Nothing makes symptoms better or worse.  Patient is a 49 y.o. male presenting with fall. The history is provided by the patient.  Fall Associated symptoms include arthralgias, headaches, myalgias and neck pain. Pertinent negatives include no chest pain, fatigue, nausea, numbness, vomiting or weakness.    Past Medical History  Diagnosis Date  . Hernia     abdominal wall  . Hyperlipidemia   . Hypertension     does not see a cardiologist  . History of kidney stones   . Phrenic nerve palsy     left side  . Anxiety    Past Surgical History  Procedure Laterality Date  . Hernia repair  2011    umbilical  . Lithotripsy    . Cystostomy w/ stent insertion    . Ventral hernia repair  03/15/2013    Dr Janee Morn  . Ventral  hernia repair N/A 03/15/2013    Procedure: LAPAROSCOPIC VENTRAL HERNIA;  Surgeon: Liz Malady, MD;  Location: [facility] OR;  Service: General;  Laterality: N/A;  . Insertion of mesh N/A 03/15/2013    Procedure: INSERTION OF MESH;  Surgeon: Liz Malady, MD;  Location: [facility] OR;  Service: General;  Laterality: N/A;   Family History  Problem Relation Age of Onset  . Cancer Mother     breast  . Heart disease Father     CHF   History  Substance Use Topics  . Smoking status: Former Smoker -- 1.00 packs/day for 10 years    Quit date: 02/16/1992  . Smokeless tobacco: Never Used  . Alcohol Use: 8.4 oz/week    14 Glasses of wine per week     Comment: 2 glasses of wine every evening    Review of Systems  Constitutional: Negative for fatigue.  HENT: Negative for tinnitus.   Eyes: Negative for photophobia, pain and visual disturbance.  Respiratory: Negative for shortness of breath.   Cardiovascular: Negative for chest pain.  Gastrointestinal: Negative for nausea and vomiting.  Musculoskeletal: Positive for myalgias, arthralgias and neck pain. Negative for back pain and gait problem.  Skin: Negative for wound.  Neurological: Positive for headaches. Negative for dizziness, weakness, light-headedness and numbness.  Psychiatric/Behavioral: Positive for confusion. Negative for decreased concentration.      Allergies  Review of patient's allergies indicates no known allergies.  Home Medications  Prior to Admission medications   Medication Sig Start Date End Date Taking? Authorizing Provider  atorvastatin (LIPITOR) 20 MG tablet Take 20 mg by mouth daily.    Historical Provider, MD  lisinopril (PRINIVIL,ZESTRIL) 20 MG tablet Take 20 mg by mouth daily.    Historical Provider, MD   BP 155/94 mmHg  Pulse 65  Temp(Src) 98.2 F (36.8 C)  Resp 20  Wt 263 lb 1 oz (119.324 kg)  SpO2 100% Physical Exam  Constitutional: He is oriented to person, place, and time. He appears well-developed and  well-nourished.  HENT:  Head: Normocephalic. Head is without raccoon's eyes and without Battle's sign.  Right Ear: Tympanic membrane, external ear and ear canal normal. No hemotympanum.  Left Ear: Tympanic membrane, external ear and ear canal normal. No hemotympanum.  Nose: Nose normal. No nasal septal hematoma.  Mouth/Throat: Oropharynx is clear and moist.  Small hematoma to the left occiput.  Eyes: Conjunctivae, EOM and lids are normal. Pupils are equal, round, and reactive to light.  No visible hyphema  Neck: Normal range of motion. Neck supple.  Cardiovascular: Normal rate and regular rhythm.   No murmur heard. Pulmonary/Chest: Effort normal and breath sounds normal. No respiratory distress. He has no wheezes. He has no rales.  Abdominal: Soft. There is no tenderness. There is no rebound and no guarding.  Musculoskeletal: Normal range of motion.       Right shoulder: Normal.       Left shoulder: He exhibits tenderness. He exhibits normal range of motion and no bony tenderness.       Left elbow: Normal.       Cervical back: He exhibits tenderness. He exhibits normal range of motion and no bony tenderness.       Thoracic back: He exhibits no tenderness and no bony tenderness.       Lumbar back: He exhibits no tenderness and no bony tenderness.       Back:       Left upper arm: Normal.  Neurological: He is alert and oriented to person, place, and time. He has normal strength and normal reflexes. No cranial nerve deficit or sensory deficit. Coordination normal. GCS eye subscore is 4. GCS verbal subscore is 5. GCS motor subscore is 6.  Skin: Skin is warm and dry.  Psychiatric: He has a normal mood and affect.  Nursing note and vitals reviewed.   ED Course  Procedures (including critical care time) Labs Review Labs Reviewed - No data to display  Imaging Review Ct Head Wo Contrast  06/18/2014   CLINICAL DATA:  Slipped on ice. Hit head, without loss of consciousness. Brief  confusion, with headache and posterior neck pain. Initial encounter.  EXAM: CT HEAD WITHOUT CONTRAST  CT CERVICAL SPINE WITHOUT CONTRAST  TECHNIQUE: Multidetector CT imaging of the head and cervical spine was performed following the standard protocol without intravenous contrast. Multiplanar CT image reconstructions of the cervical spine were also generated.  COMPARISON:  None.  FINDINGS: CT HEAD FINDINGS  There is no evidence of acute infarction, mass lesion, or intra- or extra-axial hemorrhage on CT.  Prominence of the sulci suggests mild cortical volume loss. There is significant cerebellar atrophy, with prominent posterior CSF space, but no definite arachnoid cyst.  The brainstem and fourth ventricle are within normal limits. The basal ganglia are unremarkable in appearance. The cerebral hemispheres demonstrate grossly normal gray-white differentiation. No mass effect or midline shift is seen.  There is no evidence of fracture; visualized osseous  structures are unremarkable in appearance. The orbits are within normal limits. A small mucus retention cyst or polyp is noted within the right maxillary sinus, with mild partial opacification of the right maxillary sinus. The remaining paranasal sinuses and mastoid air cells are well-aerated. No significant soft tissue abnormalities are seen.  CT CERVICAL SPINE FINDINGS  There is no evidence of fracture or subluxation. Vertebral bodies demonstrate normal height and alignment. Intervertebral disc spaces are preserved. Prevertebral soft tissues are within normal limits. The visualized neural foramina are grossly unremarkable.  The thyroid gland is unremarkable in appearance. The visualized lung apices are clear. No significant soft tissue abnormalities are seen.  IMPRESSION: 1. No evidence of traumatic intracranial injury or fracture. 2. No evidence of fracture or subluxation along the cervical spine. 3. Mild cortical volume loss noted. Significant cerebellar atrophy  seen. 4. Small mucus retention cyst or polyp within the right maxillary sinus, with mild partial opacification of the right maxillary sinus.   Electronically Signed   By: Roanna RaiderJeffery  Chang M.D.   On: 06/18/2014 22:35   Ct Cervical Spine Wo Contrast  06/18/2014   CLINICAL DATA:  Slipped on ice. Hit head, without loss of consciousness. Brief confusion, with headache and posterior neck pain. Initial encounter.  EXAM: CT HEAD WITHOUT CONTRAST  CT CERVICAL SPINE WITHOUT CONTRAST  TECHNIQUE: Multidetector CT imaging of the head and cervical spine was performed following the standard protocol without intravenous contrast. Multiplanar CT image reconstructions of the cervical spine were also generated.  COMPARISON:  None.  FINDINGS: CT HEAD FINDINGS  There is no evidence of acute infarction, mass lesion, or intra- or extra-axial hemorrhage on CT.  Prominence of the sulci suggests mild cortical volume loss. There is significant cerebellar atrophy, with prominent posterior CSF space, but no definite arachnoid cyst.  The brainstem and fourth ventricle are within normal limits. The basal ganglia are unremarkable in appearance. The cerebral hemispheres demonstrate grossly normal gray-white differentiation. No mass effect or midline shift is seen.  There is no evidence of fracture; visualized osseous structures are unremarkable in appearance. The orbits are within normal limits. A small mucus retention cyst or polyp is noted within the right maxillary sinus, with mild partial opacification of the right maxillary sinus. The remaining paranasal sinuses and mastoid air cells are well-aerated. No significant soft tissue abnormalities are seen.  CT CERVICAL SPINE FINDINGS  There is no evidence of fracture or subluxation. Vertebral bodies demonstrate normal height and alignment. Intervertebral disc spaces are preserved. Prevertebral soft tissues are within normal limits. The visualized neural foramina are grossly unremarkable.  The  thyroid gland is unremarkable in appearance. The visualized lung apices are clear. No significant soft tissue abnormalities are seen.  IMPRESSION: 1. No evidence of traumatic intracranial injury or fracture. 2. No evidence of fracture or subluxation along the cervical spine. 3. Mild cortical volume loss noted. Significant cerebellar atrophy seen. 4. Small mucus retention cyst or polyp within the right maxillary sinus, with mild partial opacification of the right maxillary sinus.   Electronically Signed   By: Roanna RaiderJeffery  Chang M.D.   On: 06/18/2014 22:35     EKG Interpretation None       11:05 PM Patient seen and examined. Patient and wife informed of CT results. C-collar removed. Full range of motion in all 6 directions with some tightness in his left paracervical musculature.  Vital signs reviewed and are as follows: BP 155/94 mmHg  Pulse 65  Temp(Src) 98.2 F (36.8 C)  Resp 20  Wt 263 lb 1 oz (119.324 kg)  SpO2 100%   Extensive discussion regarding signs and symptoms of concussion and if he has any persistent symptoms lasting for more than 1-2 days, he should follow-up with his PCP for recheck. Told to avoid strenuous exercise with any symptoms.  Patient was counseled on head injury precautions and symptoms that should indicate their return to the ED. These include severe worsening headache, vision changes, confusion, loss of consciousness, trouble walking, nausea & vomiting, or weakness/tingling in extremities.    Patient counseled on proper use of muscle relaxant medication.  They were told not to drink alcohol, drive any vehicle, or do any dangerous activities while taking this medication.  Patient verbalized understanding.     MDM   Final diagnoses:  Head injury, initial encounter   Patient with head injury after slip and fall. Negative loss of consciousness however patient does have amnesia to the event and just prior. CT of head and neck are negative for acute injury. Patient  does not have any significant concussion symptoms at this time, however cannot rule out concussion. He has normal neurological exam at this time and symptoms are improving. He is at baseline mentation per wife. Patient with some left shoulder soreness but full range of motion. Left and right upper extremity are neurovascularly intact. Discharged home with symptomatic management. Discussed return instructions with patient and wife at bedside.    Renne Crigler, PA-C 06/18/14 2316  Tilden Fossa, MD 06/19/14 [phone]

## 2016-07-05 IMAGING — CT CT HEAD W/O CM
1 series · 1 of 2 positions shown · non-contrast
Comparison: None.

CLINICAL DATA: Slipped on ice. Hit head, without loss of
consciousness. Brief confusion, with headache and posterior neck
pain. Initial encounter.

EXAM:
CT HEAD WITHOUT CONTRAST
CT CERVICAL SPINE WITHOUT CONTRAST
TECHNIQUE: Multidetector CT imaging of the head and cervical spine was
performed following the standard protocol without intravenous
contrast. Multiplanar CT image reconstructions of the cervical spine
were also generated.

[Series 100: scout · coronal · 0.6mm · 0.98mm/px · 1 of 2 slices shown]
[im 2/2]
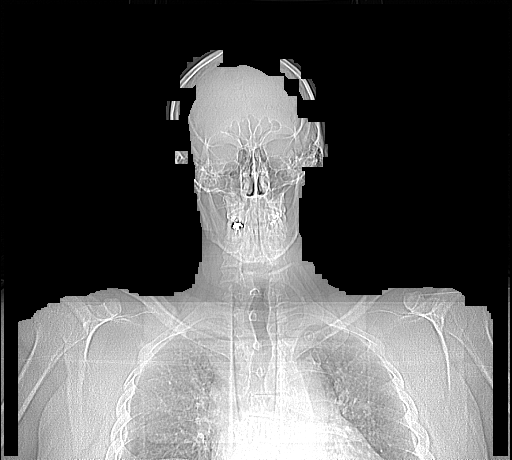

[1 of 2 positions shown; findings below may reference images not displayed]

FINDINGS: CT HEAD FINDINGS

There is no evidence of acute infarction, mass lesion, or intra- or
extra-axial hemorrhage on CT.

Prominence of the sulci suggests mild cortical volume loss. There is
significant cerebellar atrophy, with prominent posterior CSF space,
but no definite arachnoid cyst.

The brainstem and fourth ventricle are within normal limits. The
basal ganglia are unremarkable in appearance. The cerebral
hemispheres demonstrate grossly normal gray-white differentiation.
No mass effect or midline shift is seen.

There is no evidence of fracture; visualized osseous structures are
unremarkable in appearance. The orbits are within normal limits. A
small mucus retention cyst or polyp is noted within the right
maxillary sinus, with mild partial opacification of the right
maxillary sinus. The remaining paranasal sinuses and mastoid air
cells are well-aerated. No significant soft tissue abnormalities are
seen.

CT CERVICAL SPINE FINDINGS

There is no evidence of fracture or subluxation. Vertebral bodies
demonstrate normal height and alignment. Intervertebral disc spaces
are preserved. Prevertebral soft tissues are within normal limits.
The visualized neural foramina are grossly unremarkable.

The thyroid gland is unremarkable in appearance. The visualized lung
apices are clear. No significant soft tissue abnormalities are seen.
IMPRESSION: 1. No evidence of traumatic intracranial injury or fracture.
2. No evidence of fracture or subluxation along the cervical spine.
3. Mild cortical volume loss noted. Significant cerebellar atrophy
seen.
4. Small mucus retention cyst or polyp within the right maxillary
sinus, with mild partial opacification of the right maxillary sinus.

## 2016-12-08 DIAGNOSIS — R0683 Snoring: Secondary | ICD-10-CM | POA: Diagnosis not present

## 2016-12-08 DIAGNOSIS — R0609 Other forms of dyspnea: Secondary | ICD-10-CM | POA: Diagnosis not present

## 2016-12-08 DIAGNOSIS — R400 Somnolence: Secondary | ICD-10-CM | POA: Diagnosis not present

## 2016-12-08 DIAGNOSIS — J351 Hypertrophy of tonsils: Secondary | ICD-10-CM | POA: Diagnosis not present

## 2016-12-09 ENCOUNTER — Ambulatory Visit: Payer: BLUE CROSS/BLUE SHIELD | Admitting: Cardiology

## 2016-12-13 ENCOUNTER — Encounter: Payer: Self-pay | Admitting: Cardiology

## 2016-12-13 ENCOUNTER — Ambulatory Visit (INDEPENDENT_AMBULATORY_CARE_PROVIDER_SITE_OTHER): Payer: BLUE CROSS/BLUE SHIELD | Admitting: Cardiology

## 2016-12-13 VITALS — BP 146/100 | HR 82 | Ht 74.5 in | Wt 290.0 lb

## 2016-12-13 DIAGNOSIS — R0609 Other forms of dyspnea: Secondary | ICD-10-CM

## 2016-12-13 DIAGNOSIS — I2510 Atherosclerotic heart disease of native coronary artery without angina pectoris: Secondary | ICD-10-CM | POA: Insufficient documentation

## 2016-12-13 DIAGNOSIS — E782 Mixed hyperlipidemia: Secondary | ICD-10-CM | POA: Diagnosis not present

## 2016-12-13 DIAGNOSIS — R0602 Shortness of breath: Secondary | ICD-10-CM | POA: Insufficient documentation

## 2016-12-13 DIAGNOSIS — G588 Other specified mononeuropathies: Secondary | ICD-10-CM

## 2016-12-13 DIAGNOSIS — I10 Essential (primary) hypertension: Secondary | ICD-10-CM | POA: Diagnosis not present

## 2016-12-13 DIAGNOSIS — R0600 Dyspnea, unspecified: Secondary | ICD-10-CM

## 2016-12-13 DIAGNOSIS — G5680 Other specified mononeuropathies of unspecified upper limb: Secondary | ICD-10-CM

## 2016-12-13 NOTE — Progress Notes (Signed)
Cardiology Office Note:    Date:  12/13/2016   ID:  Edwin Lucas, DOB 01/03/1966, MRN [id number]  PCP:  Creola Cornusso, John, MD  Cardiologist:  Garwin Brothersajan R Revankar, MD   Referring MD: Creola Cornusso, John, MD    ASSESSMENT:    1. Dyspnea on exertion   2. Essential hypertension   3. Mixed dyslipidemia   4. Phrenic nerve palsy    PLAN:    In order of problems listed above:  I discussed my findings with the patient at extensive length. I reviewed his symptoms of shortness of breath. Fortunately she does not have any significant symptoms on exertion and that is reassuring. However in view of  multiple risk factors for coronary artery disease and the fact that he leads a sedentary lifestyle, I we'll obtain an exercise Cardiolite stress test. I discussed this with him at length and he vocalized understanding. He knows to go to the nearest emergency room for any concerning symptoms. Diet was discussed for essential hypertension and dyslipidemia. Again I had a very lengthy discussion with him about obesity and the risks. Once his stress test was done and if it was fine encouraged him and active lifestyle and he verbalized understanding. He will be seen in follow-up appointment in 2 months or earlier if he has any concerns.   Medication Adjustments/Labs and Tests Ordered: Current me continuously dicines are reviewed at length with the patient today.  Concerns regarding medicines are outlined above.  Orders Placed This Encounter  Procedures  . Myocardial Perfusion Imaging  . EKG 12-Lead   No orders of the defined types were placed in this encounter.    History of Present Illness:    Edwin Lucas is a 51 y.o. male who is being seen today for the evaluation of Shortness of breath at the request of Creola Cornusso, John, MD. Patient is a pleasant 51 year old male. He has past medical history of essential and hypertension and dyslipidemia. He is morbidly obese and leads a very sedentary lifestyle. He has history  of phrenic nerve palsy. He mentions to me that his had history of feeling short of breath at times and he knows that it is suitable for any he. He is referred now for shortness of breath. He denies any chest pain orthopnea or PND. At the time of my evaluation he is alert awake oriented and in no distress. He mentions that he walks his dog for about 20 minutes without any symptoms.  Past Medical History:  Diagnosis Date  . Anxiety   . Hernia    abdominal wall  . History of kidney stones   . Hyperlipidemia   . Hypertension    does not see a cardiologist  . Phrenic nerve palsy    left side    Past Surgical History:  Procedure Laterality Date  . CYSTOSTOMY W/ STENT INSERTION    . HERNIA REPAIR  2011   umbilical  . INSERTION OF MESH N/A 03/15/2013   Procedure: INSERTION OF MESH;  Surgeon: Liz MaladyBurke E Thompson, MD;  Location: [facility] OR;  Service: General;  Laterality: N/A;  . LITHOTRIPSY    . VENTRAL HERNIA REPAIR  03/15/2013   Dr Janee Mornhompson  . VENTRAL HERNIA REPAIR N/A 03/15/2013   Procedure: LAPAROSCOPIC VENTRAL HERNIA;  Surgeon: Liz MaladyBurke E Thompson, MD;  Location: [facility] OR;  Service: General;  Laterality: N/A;    Current Medications: Current Meds  Medication Sig  . atorvastatin (LIPITOR) 20 MG tablet Take 20 mg by mouth daily.  Marland Kitchen. lisinopril (  PRINIVIL,ZESTRIL) 20 MG tablet Take 20 mg by mouth daily.  Marland Kitchen venlafaxine XR (EFFEXOR-XR) 150 MG 24 hr capsule Take 1 capsule by mouth daily.     Allergies:   Patient has no known allergies.   Social History   Social History  . Marital status: Married    Spouse name: N/A  . Number of children: N/A  . Years of education: N/A   Social History Main Topics  . Smoking status: Former Smoker    Packs/day: 1.00    Years: 10.00    Quit date: 02/16/1992  . Smokeless tobacco: Former Neurosurgeon    Types: Snuff  . Alcohol use 8.4 oz/week    14 Glasses of wine per week     Comment: 2-3 glasses of wine every evening, beer  . Drug use: No  . Sexual activity: Not  Asked   Other Topics Concern  . None   Social History Narrative  . None     Family History: The patient's family history includes Cancer in his mother; Heart disease in his father.  ROS:   Please see the history of present illness.    All other systems reviewed and are negative.  EKGs/Labs/Other Studies Reviewed:    The following studies were reviewed today: I reviewed the records sent from the doctor's office. EKG was reviewed also.   Recent Labs: No results found for requested labs within last 8760 hours.  Recent Lipid Panel No results found for: CHOL, TRIG, HDL, CHOLHDL, VLDL, LDLCALC, LDLDIRECT  Physical Exam:    VS:  BP (!) 146/100 (BP Location: Left Arm)   Pulse 82   Ht 6' 2.5" (1.892 m)   Wt 290 lb (131.5 kg)   SpO2 96%   BMI 36.74 kg/m     Wt Readings from Last 3 Encounters:  12/13/16 290 lb (131.5 kg)  06/18/14 263 lb 1 oz (119.3 kg)  05/02/13 266 lb 8 oz (120.9 kg)     GEN: Patient is in no acute distress HEENT: Normal NECK: No JVD; No carotid bruits LYMPHATICS: No lymphadenopathy CARDIAC: S1 S2 regular, 2/6 systolic murmur at the apex. RESPIRATORY:  Clear to auscultation without rales, wheezing or rhonchi  ABDOMEN: Soft, non-tender, non-distended MUSCULOSKELETAL:  No edema; No deformity  SKIN: Warm and dry NEUROLOGIC:  Alert and oriented x 3 PSYCHIATRIC:  Normal affect    Signed, Garwin Brothers, MD  12/13/2016 12:29 PM    [facility]

## 2016-12-13 NOTE — Patient Instructions (Signed)
Medication Instructions:  Your physician recommends that you continue on your current medications as directed. Please refer to the Current Medication list given to you today.   Labwork: None  Testing/Procedures: Your physician has requested that you have en exercise stress myoview. For further information please visit [email or web address]. Please follow instruction sheet, as given.  You had an EKG today.    Follow-Up: Your physician recommends that you schedule a follow-up appointment in: 6 weeks.   Any Other Special Instructions Will Be Listed Below (If Applicable).     If you need a refill on your cardiac medications before your next appointment, please call your pharmacy.

## 2016-12-16 ENCOUNTER — Telehealth (HOSPITAL_COMMUNITY): Payer: Self-pay | Admitting: *Deleted

## 2016-12-16 NOTE — Telephone Encounter (Signed)
Patient given detailed instructions per Myocardial Perfusion Study Information Sheet for the test on  12/20/16. Patient notified to arrive 15 minutes early and that it is imperative to arrive on time for appointment to keep from having the test rescheduled.  If you need to cancel or reschedule your appointment, please call the office within 24 hours of your appointment. . Patient verbalized understanding.Edwin Lucas Jacqueline    

## 2016-12-20 ENCOUNTER — Ambulatory Visit (HOSPITAL_COMMUNITY): Payer: BLUE CROSS/BLUE SHIELD

## 2016-12-21 ENCOUNTER — Ambulatory Visit (HOSPITAL_COMMUNITY): Payer: BLUE CROSS/BLUE SHIELD

## 2016-12-22 ENCOUNTER — Ambulatory Visit (HOSPITAL_COMMUNITY): Payer: BLUE CROSS/BLUE SHIELD

## 2016-12-23 ENCOUNTER — Other Ambulatory Visit: Payer: Self-pay

## 2016-12-23 ENCOUNTER — Ambulatory Visit (HOSPITAL_COMMUNITY): Payer: BLUE CROSS/BLUE SHIELD

## 2016-12-23 DIAGNOSIS — I10 Essential (primary) hypertension: Secondary | ICD-10-CM

## 2016-12-23 DIAGNOSIS — R0609 Other forms of dyspnea: Secondary | ICD-10-CM

## 2016-12-23 DIAGNOSIS — R0602 Shortness of breath: Secondary | ICD-10-CM

## 2016-12-23 DIAGNOSIS — R0600 Dyspnea, unspecified: Secondary | ICD-10-CM

## 2016-12-23 DIAGNOSIS — E782 Mixed hyperlipidemia: Secondary | ICD-10-CM

## 2017-01-05 ENCOUNTER — Institutional Professional Consult (permissible substitution): Payer: Self-pay | Admitting: Neurology

## 2017-01-05 ENCOUNTER — Telehealth (HOSPITAL_COMMUNITY): Payer: Self-pay

## 2017-01-05 NOTE — Telephone Encounter (Signed)
Encounter complete. 

## 2017-01-07 ENCOUNTER — Ambulatory Visit (HOSPITAL_COMMUNITY)
Admission: RE | Admit: 2017-01-07 | Discharge: 2017-01-07 | Disposition: A | Discharge status: Home / Self Care | Payer: BLUE CROSS/BLUE SHIELD | Source: Ambulatory Visit | Attending: Cardiovascular Disease | Admitting: Cardiovascular Disease

## 2017-01-07 DIAGNOSIS — I10 Essential (primary) hypertension: Secondary | ICD-10-CM | POA: Diagnosis not present

## 2017-01-07 DIAGNOSIS — E782 Mixed hyperlipidemia: Secondary | ICD-10-CM

## 2017-01-07 DIAGNOSIS — R0602 Shortness of breath: Secondary | ICD-10-CM | POA: Diagnosis not present

## 2017-01-07 LAB — EXERCISE TOLERANCE TEST
Estimated workload: 9.2 METS
Exercise duration (min): 7 min
Exercise duration (sec): 28 s
MPHR: 169 {beats}/min
Peak HR: 146 {beats}/min
Percent HR: 86 %
RPE: 19
Rest HR: 86 {beats}/min

## 2017-01-25 DIAGNOSIS — R7309 Other abnormal glucose: Secondary | ICD-10-CM | POA: Diagnosis not present

## 2017-01-25 DIAGNOSIS — Z125 Encounter for screening for malignant neoplasm of prostate: Secondary | ICD-10-CM | POA: Diagnosis not present

## 2017-01-25 DIAGNOSIS — M109 Gout, unspecified: Secondary | ICD-10-CM | POA: Diagnosis not present

## 2017-01-25 DIAGNOSIS — Z0000 Encounter for general adult medical examination without abnormal findings: Secondary | ICD-10-CM | POA: Diagnosis not present

## 2017-01-25 DIAGNOSIS — I10 Essential (primary) hypertension: Secondary | ICD-10-CM | POA: Diagnosis not present

## 2017-01-25 DIAGNOSIS — R946 Abnormal results of thyroid function studies: Secondary | ICD-10-CM | POA: Diagnosis not present

## 2017-01-31 DIAGNOSIS — R0609 Other forms of dyspnea: Secondary | ICD-10-CM | POA: Diagnosis not present

## 2017-01-31 DIAGNOSIS — J351 Hypertrophy of tonsils: Secondary | ICD-10-CM | POA: Diagnosis not present

## 2017-01-31 DIAGNOSIS — Z125 Encounter for screening for malignant neoplasm of prostate: Secondary | ICD-10-CM | POA: Diagnosis not present

## 2017-01-31 DIAGNOSIS — Z0000 Encounter for general adult medical examination without abnormal findings: Secondary | ICD-10-CM | POA: Diagnosis not present

## 2017-01-31 DIAGNOSIS — R61 Generalized hyperhidrosis: Secondary | ICD-10-CM | POA: Diagnosis not present

## 2017-01-31 DIAGNOSIS — Z1389 Encounter for screening for other disorder: Secondary | ICD-10-CM | POA: Diagnosis not present

## 2017-01-31 DIAGNOSIS — R946 Abnormal results of thyroid function studies: Secondary | ICD-10-CM | POA: Diagnosis not present

## 2017-02-01 ENCOUNTER — Encounter: Payer: Self-pay | Admitting: Cardiology

## 2017-02-01 ENCOUNTER — Encounter: Payer: Self-pay | Admitting: Neurology

## 2017-02-01 ENCOUNTER — Ambulatory Visit (INDEPENDENT_AMBULATORY_CARE_PROVIDER_SITE_OTHER): Payer: BLUE CROSS/BLUE SHIELD | Admitting: Cardiology

## 2017-02-01 VITALS — BP 124/64 | HR 91 | Ht 74.5 in | Wt 288.8 lb

## 2017-02-01 DIAGNOSIS — R0602 Shortness of breath: Secondary | ICD-10-CM | POA: Diagnosis not present

## 2017-02-01 DIAGNOSIS — E782 Mixed hyperlipidemia: Secondary | ICD-10-CM | POA: Diagnosis not present

## 2017-02-01 DIAGNOSIS — I10 Essential (primary) hypertension: Secondary | ICD-10-CM

## 2017-02-01 NOTE — Progress Notes (Signed)
Cardiology Office Note:    Date:  02/01/2017   ID:  Edwin Lucas, DOB 12/06/1965, MRN [id number]  PCP:  Creola Cornusso, John, MD  Cardiologist:  Garwin Brothersajan R Isaly Fasching, MD   Referring MD: Creola Cornusso, John, MD    ASSESSMENT:    1. Shortness of breath   2. Essential hypertension   3. Mixed dyslipidemia    PLAN:    In order of problems listed above:  1. I discussed my findings with the patient at extensive length. Primary prevention stressed to the patient. Importance of compliance with diet and medications stressed and he vocalized understanding. He was urged to exercise on a regular basis. Lipids were reviewed diet was discussed with dyslipidemia. Importance of exercise stressed he has abdominal obesity and risks were explained and he plans to intensify his diet and exercise. He will be seen in follow-up appointment in 3 months or earlier if he has any concerns. He had multiple questions which were answered to his satisfaction.   Medication Adjustments/Labs and Tests Ordered: Current medicines are reviewed at length with the patient today.  Concerns regarding medicines are outlined above.  Orders Placed This Encounter  Procedures  . ECHOCARDIOGRAM COMPLETE   No orders of the defined types were placed in this encounter.    Chief Complaint  Patient presents with  . Follow-up    "Nothing has changed" still having the SOB      History of Present Illness:    Edwin Lucas is a 51 y.o. male . Patient was evaluated for chest pain kind of symptoms and he underwent stress testing which was unremarkable. He mentions to me that he is now walking on a regular basis without any symptoms. Is trying to lose weight.he mentions to me that he has been advised to undergo sleep apnea study. He tells me that he snores significantly. At the time of my evaluation is alert awake oriented and in no distress.  Past Medical History:  Diagnosis Date  . Anxiety   . Hernia    abdominal wall  . History of kidney  stones   . Hyperlipidemia   . Hypertension    does not see a cardiologist  . Phrenic nerve palsy    left side    Past Surgical History:  Procedure Laterality Date  . CYSTOSTOMY W/ STENT INSERTION    . HERNIA REPAIR  2011   umbilical  . INSERTION OF MESH N/A 03/15/2013   Procedure: INSERTION OF MESH;  Surgeon: Liz MaladyBurke E Thompson, MD;  Location: [facility] OR;  Service: General;  Laterality: N/A;  . LITHOTRIPSY    . VENTRAL HERNIA REPAIR  03/15/2013   Dr Janee Mornhompson  . VENTRAL HERNIA REPAIR N/A 03/15/2013   Procedure: LAPAROSCOPIC VENTRAL HERNIA;  Surgeon: Liz MaladyBurke E Thompson, MD;  Location: [facility] OR;  Service: General;  Laterality: N/A;    Current Medications: Current Meds  Medication Sig  . atorvastatin (LIPITOR) 40 MG tablet Take 40 mg by mouth daily.  . colchicine 0.6 MG tablet Take 0.6 mg by mouth daily.  Marland Kitchen. lisinopril (PRINIVIL,ZESTRIL) 20 MG tablet Take 20 mg by mouth daily.  . predniSONE (DELTASONE) 20 MG tablet Take 20 mg by mouth daily.  Marland Kitchen. venlafaxine XR (EFFEXOR-XR) 150 MG 24 hr capsule Take 1 capsule by mouth daily.  . [DISCONTINUED] atorvastatin (LIPITOR) 20 MG tablet Take 20 mg by mouth daily.     Allergies:   Patient has no known allergies.   Social History   Social History  . Marital status:  Married    Spouse name: N/A  . Number of children: N/A  . Years of education: N/A   Social History Main Topics  . Smoking status: Former Smoker    Packs/day: 1.00    Years: 10.00    Quit date: 02/16/1992  . Smokeless tobacco: Former Neurosurgeon    Types: Snuff  . Alcohol use 8.4 oz/week    14 Glasses of wine per week     Comment: 2-3 glasses of wine every evening, beer  . Drug use: No  . Sexual activity: Not Asked   Other Topics Concern  . None   Social History Narrative  . None     Family History: The patient's family history includes Cancer in his mother; Heart disease in his father.  ROS:   Please see the history of present illness.    All other systems reviewed and are  negative.  EKGs/Labs/Other Studies Reviewed:    The following studies were reviewed today: I reviewed stress test report with the patient at extensive length.   Recent Labs: No results found for requested labs within last 8760 hours.  Recent Lipid Panel No results found for: CHOL, TRIG, HDL, CHOLHDL, VLDL, LDLCALC, LDLDIRECT  Physical Exam:    VS:  BP 124/64   Pulse 91   Ht 6' 2.5" (1.892 m)   Wt 288 lb 12.8 oz (131 kg)   SpO2 95%   BMI 36.58 kg/m     Wt Readings from Last 3 Encounters:  02/01/17 288 lb 12.8 oz (131 kg)  12/13/16 290 lb (131.5 kg)  06/18/14 263 lb 1 oz (119.3 kg)     GEN: Patient is in no acute distress HEENT: Normal NECK: No JVD; No carotid bruits LYMPHATICS: No lymphadenopathy CARDIAC: Hear sounds regular, 2/6 systolic murmur at the apex. RESPIRATORY:  Clear to auscultation without rales, wheezing or rhonchi  ABDOMEN: Soft, non-tender, non-distended MUSCULOSKELETAL:  No edema; No deformity  SKIN: Warm and dry NEUROLOGIC:  Alert and oriented x 3 PSYCHIATRIC:  Normal affect   Signed, Garwin Brothers, MD  02/01/2017 2:15 PM    [facility]

## 2017-02-01 NOTE — Patient Instructions (Signed)
Medication Instructions:   Your physician recommends that you continue on your current medications as directed. Please refer to the Current Medication list given to you today.   Labwork:  NONE  Testing/Procedures:  Your physician has requested that you have an echocardiogram. Echocardiography is a painless test that uses sound waves to create images of your heart. It provides your doctor with information about the size and shape of your heart and how well your heart's chambers and valves are working. This procedure takes approximately one hour. There are no restrictions for this procedure.    Follow-Up:  Your physician recommends that you schedule a follow-up appointment in: 3 months with Dr. Tomie Chinaevankar.    Any Other Special Instructions Will Be Listed Below (If Applicable).     If you need a refill on your cardiac medications before your next appointment, please call your pharmacy.

## 2017-02-02 ENCOUNTER — Encounter: Payer: Self-pay | Admitting: Neurology

## 2017-02-02 ENCOUNTER — Ambulatory Visit (INDEPENDENT_AMBULATORY_CARE_PROVIDER_SITE_OTHER): Payer: BLUE CROSS/BLUE SHIELD | Admitting: Neurology

## 2017-02-02 VITALS — BP 141/91 | HR 84 | Ht 74.0 in | Wt 285.0 lb

## 2017-02-02 DIAGNOSIS — G4734 Idiopathic sleep related nonobstructive alveolar hypoventilation: Secondary | ICD-10-CM | POA: Insufficient documentation

## 2017-02-02 DIAGNOSIS — R0602 Shortness of breath: Secondary | ICD-10-CM

## 2017-02-02 DIAGNOSIS — G588 Other specified mononeuropathies: Secondary | ICD-10-CM

## 2017-02-02 DIAGNOSIS — G4730 Sleep apnea, unspecified: Secondary | ICD-10-CM | POA: Diagnosis not present

## 2017-02-02 DIAGNOSIS — G4710 Hypersomnia, unspecified: Secondary | ICD-10-CM | POA: Diagnosis not present

## 2017-02-02 DIAGNOSIS — G5680 Other specified mononeuropathies of unspecified upper limb: Secondary | ICD-10-CM

## 2017-02-02 NOTE — Progress Notes (Signed)
SLEEP MEDICINE CLINIC   Provider:  Melvyn Novas, M D  Primary Care Physician:  Creola Corn, MD   Referring Provider: Creola Corn, MD    Chief Complaint  Patient presents with  . New Patient (Initial Visit)    pt alone, room 11, pt has been having trouble breathing in general. pt wants to rule out sleep apnea. pt states that he snores and has been told he stopped breathing.. pt complains of being tired thruout the day.     HPI:  Edwin Lucas is a 51 y.o. male , seen here as in a referral  from Dr. Timothy Lasso for sleep evaluation,  Chief complaint according to patient : " I am having trouble breathing, phrenic nerve palsy - diaphragmatic weakness- beginning 14 years ago." I am fatigued.  I have vitiligo autoimmune conditions.     Sleep habits are as follows: Edwin Lucas usually watches TV for the last hour or so before he retreats to bed. He reports that when he comes home from work after 5 PM he would be able easily to fall asleep if in a comfortable position. He does not nap regularly. Bedtime for him is usually around 10:30 PM, his bedroom is cool quiet and dark and she does not use any screen device or TV in the bedroom. He sleeps usually on his side, on only one pillow. The bedroom is not shared, he snores and his wife sleeps elsewhere. He will have usually one bathroom break around 3:30 AM. Edwin Lucas usually wakes up before his alarm rings, around 6;50.with a very dry mouth. Alarm rings at 7.AM  He feels refreshed after his shower, leaves the house by 8.30.    Sleep medical history and family sleep history:  Father was a snorer, died of CHF.  Tonsillectomy- none. Deviated septum and asymetric tonsillary enlargement- but no surgery.  No sleep walking history. Had night terrors.   Social history:  No shift work history,   Review of Systems: Out of a complete 14 system review, the patient complains of only the following symptoms, and all other reviewed systems are negative.  snoring. Fatigue.   Epworth score 45 , Fatigue severity score 9  , depression score n/a   Social History   Social History  . Marital status: Married    Spouse name: N/A  . Number of children: N/A  . Years of education: N/A   Occupational History  . Not on file.   Social History Main Topics  . Smoking status: Former Smoker    Packs/day: 1.00    Years: 10.00    Quit date: 02/16/1992  . Smokeless tobacco: Former Neurosurgeon    Types: Snuff  . Alcohol use 8.4 oz/week    14 Glasses of wine per week     Comment: 2-3 glasses of wine every evening, beer  . Drug use: No  . Sexual activity: Not on file   Other Topics Concern  . Not on file   Social History Narrative  . No narrative on file    Family History  Problem Relation Age of Onset  . Cancer Mother        breast  . Heart disease Father        CHF    Past Medical History:  Diagnosis Date  . Anxiety   . Hernia    abdominal wall  . History of kidney stones   . Hyperlipidemia   . Hypertension    does not see a cardiologist  .  Phrenic nerve palsy    left side    Past Surgical History:  Procedure Laterality Date  . CYSTOSTOMY W/ STENT INSERTION    . HERNIA REPAIR  2011   umbilical  . INSERTION OF MESH N/A 03/15/2013   Procedure: INSERTION OF MESH;  Surgeon: Liz Malady, MD;  Location: [facility] OR;  Service: General;  Laterality: N/A;  . LITHOTRIPSY    . VENTRAL HERNIA REPAIR  03/15/2013   Dr Janee Morn  . VENTRAL HERNIA REPAIR N/A 03/15/2013   Procedure: LAPAROSCOPIC VENTRAL HERNIA;  Surgeon: Liz Malady, MD;  Location: [facility] OR;  Service: General;  Laterality: N/A;    Current Outpatient Prescriptions  Medication Sig Dispense Refill  . atorvastatin (LIPITOR) 40 MG tablet Take 40 mg by mouth daily.  3  . lisinopril (PRINIVIL,ZESTRIL) 20 MG tablet Take 20 mg by mouth daily.    Marland Kitchen venlafaxine XR (EFFEXOR-XR) 150 MG 24 hr capsule Take 1 capsule by mouth daily.  6   No current facility-administered medications for  this visit.     Allergies as of 02/02/2017  . (No Known Allergies)    Vitals: BP (!) 141/91   Pulse 84   Ht  (1.88 m)   Wt 285 lb (129.3 kg)   BMI 36.59 kg/m  Last Weight:  Wt Readings from Last 1 Encounters:  02/02/17 285 lb (129.3 kg)   ZOX:WRUE mass index is 36.59 kg/m.     Last Height:   Ht Readings from Last 1 Encounters:  02/02/17  (1.88 m)    Physical exam:  General: The patient is awake, alert and appears not in acute distress. The patient is well groomed. Head: Normocephalic, atraumatic. Neck is supple. Mallampati 1- asymmetric tonsils. ,  neck circumference 18. Nasal airflow patent  Retrognathia is not seen.  He has a crowded lower dental status.  Cardiovascular:  Regular rate and rhythm , without  murmurs or carotid bruit. Respiratory: Lungs are clear to auscultation. He is short of breath RR is 17 Skin:  Without evidence of edema- vitiligo  Trunk: BMI is 37- abdominal girth is main obesity . The patient's posture is erect  Neurologic exam : The patient is awake and alert, oriented to place and time.    Attention span & concentration ability appears normal.  Speech is fluent, without dysarthria, dysphonia or aphasia.  Mood and affect are appropriate.  Cranial nerves: Pupils are equal and briskly reactive to light.  Extraocular movements  in vertical and horizontal planes intact and without nystagmus. Visual fields by finger perimetry are intact.Hearing to finger rub intact.  Facial sensation intact to fine touch. Facial motor strength is symmetric and tongue and uvula move midline. Shoulder shrug was symmetrical.  Motor exam: reduced  tone, poor muscle bulk but  symmetric strength in all extremities. Sensory:  Fine touch, pinprick and vibration were tested in all extremities. Proprioception tested in the upper extremities was normal. Coordination: Rapid alternating movements in the fingers/hands was normal. Finger-to-nose maneuver  normal without  evidence of ataxia, dysmetria or tremor. Gait and station: Patient walks without assistive device .Tandem gait is unfragmented. Romberg testing is negative. Deep tendon reflexes: in the  upper and lower extremities are symmetric and intact. Babinski maneuver response is  downgoing.  Assessment:  After physical and neurologic examination, review of laboratory studies,  Personal review of imaging studies, reports of other /same  Imaging studies, results of polysomnography and / or neurophysiology testing and pre-existing records as far as provided  in visit., my assessment is   1) Edwin Lucas reports shortness of breath, and periodic paralyzation of his phrenic nerve causing his diaphragm to work in sufficiently. This may also occur overnight but he has of course been more aware during daytime. He does not usually wake up air hungry but with a dry mouth and has been reported to snore loudly. He is also gained weight. His BMI at this time qualifies as moderate obesity. I am concerned that he may have hypoxemia at night being used to insufficient gas exchange and probably higher CO2 levels. I have not understood the mechanism that let to the phrenic nerve palsy and have wondered if it could be an autoimmune process as well since he has no history of trauma or surgery.   The patient was advised of the nature of the diagnosed disorder , the treatment options and the  risks for general health and wellness arising from not treating the condition.   I spent more than  45  minutes of face to face time with the patient.  Greater than 50% of time was spent in counseling and coordination of care. We have discussed the diagnosis and differential and I answered the patient's questions.    Plan:  Treatment plan and additional workup :  Split Night polysomnography- reported apnea ( witnessed by wife ) and may not tolerate CPAP as well as BiPAP , given diaphragmatic weakness.  Has a strong family history - non compliant  with CPAP, died of CHF.   Melvyn NovasARMEN Olando Willems, MD 02/02/2017, 9:01 AM  Certified in Neurology by ABPN Certified in Sleep Medicine by [facility]  [facility] [address], [address] [place], [place] [address]

## 2017-02-10 ENCOUNTER — Other Ambulatory Visit (HOSPITAL_COMMUNITY): Payer: BLUE CROSS/BLUE SHIELD

## 2017-02-10 ENCOUNTER — Encounter: Payer: Self-pay | Admitting: Internal Medicine

## 2017-02-11 ENCOUNTER — Other Ambulatory Visit: Payer: Self-pay

## 2017-02-11 ENCOUNTER — Ambulatory Visit (HOSPITAL_COMMUNITY): Payer: BLUE CROSS/BLUE SHIELD | Attending: Cardiology

## 2017-02-11 DIAGNOSIS — Z87891 Personal history of nicotine dependence: Secondary | ICD-10-CM | POA: Diagnosis not present

## 2017-02-11 DIAGNOSIS — Z8249 Family history of ischemic heart disease and other diseases of the circulatory system: Secondary | ICD-10-CM | POA: Diagnosis not present

## 2017-02-11 DIAGNOSIS — I10 Essential (primary) hypertension: Secondary | ICD-10-CM | POA: Insufficient documentation

## 2017-02-11 DIAGNOSIS — R0602 Shortness of breath: Secondary | ICD-10-CM | POA: Diagnosis not present

## 2017-02-11 DIAGNOSIS — E785 Hyperlipidemia, unspecified: Secondary | ICD-10-CM | POA: Diagnosis not present

## 2017-02-16 ENCOUNTER — Telehealth: Payer: Self-pay | Admitting: Neurology

## 2017-02-16 NOTE — Telephone Encounter (Signed)
Spoke with the patient and made the patient aware that it can take a little time for insurance to approve the sleep study's. I explained that once the approval is made the sleep lab will contact him to set that up. Pt stated he attempted calling the sleep lab and left a voicemail but he had not heard back and that is why he called [place]. I informed him once more that they will contact him once they get insurance approval and that I would let them know he was asking about this. Pt verbalized understanding and had no further questions.

## 2017-02-16 NOTE — Telephone Encounter (Signed)
Pt is asking for a call with the status of when his sleep study will be scheduled, please call

## 2017-02-22 ENCOUNTER — Other Ambulatory Visit: Payer: Self-pay | Admitting: Neurology

## 2017-02-22 ENCOUNTER — Telehealth: Payer: Self-pay | Admitting: Neurology

## 2017-02-22 DIAGNOSIS — E669 Obesity, unspecified: Secondary | ICD-10-CM

## 2017-02-22 DIAGNOSIS — G990 Autonomic neuropathy in diseases classified elsewhere: Secondary | ICD-10-CM

## 2017-02-22 DIAGNOSIS — G908 Other disorders of autonomic nervous system: Secondary | ICD-10-CM

## 2017-02-22 DIAGNOSIS — M359 Systemic involvement of connective tissue, unspecified: Secondary | ICD-10-CM

## 2017-02-22 DIAGNOSIS — G4733 Obstructive sleep apnea (adult) (pediatric): Secondary | ICD-10-CM

## 2017-02-22 DIAGNOSIS — J986 Disorders of diaphragm: Secondary | ICD-10-CM

## 2017-02-22 NOTE — Telephone Encounter (Signed)
[employer] denied Split sleep and approved HST.  Can I get an order for HST? °

## 2017-02-22 NOTE — Telephone Encounter (Signed)
Order placed

## 2017-03-07 ENCOUNTER — Ambulatory Visit (INDEPENDENT_AMBULATORY_CARE_PROVIDER_SITE_OTHER): Payer: BLUE CROSS/BLUE SHIELD | Admitting: Neurology

## 2017-03-07 DIAGNOSIS — J986 Disorders of diaphragm: Secondary | ICD-10-CM

## 2017-03-07 DIAGNOSIS — G4733 Obstructive sleep apnea (adult) (pediatric): Secondary | ICD-10-CM | POA: Diagnosis not present

## 2017-03-07 DIAGNOSIS — D8989 Other specified disorders involving the immune mechanism, not elsewhere classified: Secondary | ICD-10-CM

## 2017-03-07 DIAGNOSIS — G4734 Idiopathic sleep related nonobstructive alveolar hypoventilation: Secondary | ICD-10-CM

## 2017-03-12 NOTE — Procedures (Signed)
NAME: Edwin GarbeWilliam C. Waibel                                                         DOB: 1965-10-07 MEDICAL RECORD NUMBER [id number]                                          DOS: 03/08/17 REFERRING PHYSICIAN: Creola CornJohn Russo, MD   STUDY PERFORMED: Home Sleep Study HISTORY: Edwin GarbeWilliam C. Lefeber is a 51 y.o. male, seen here for trouble breathing, being diagnosed with phrenic nerve palsy - diaphragmatic weakness- beginning 14 years ago.  "I am fatigued.  I have vitiligo also, an autoimmune condition".   Epworth score 45, Fatigue severity score 59, depression score n/a. BMI was 36.5.        STUDY RESULTS: Total Recording: 1 hour 39 minutes Total Apnea/Hypopnea Index (AHI):   7.9 /hour Average Oxygen Saturation: 90 % Lowest Oxygen Saturation (SpO2):  83%  Time Oxygen Saturation Below 88%: 25 minutes = 25% Average Heart Rate: 77 bpm (69- 104 bpm)  IMPRESSION: Study was too short to be valid.  RECOMMENDATION: will need to repeat either HST or PSG. I certify that I have reviewed the raw data recording prior to the issuance of this report in accordance with the standards of the American Academy of Sleep Medicine (AASM). Melvyn Novasarmen Vegas Fritze, MD    03-08-2017 Medical Director [facility] and Diplomat of the ABPN and ABSM Member of ,and accredited by, the AASM

## 2017-03-24 ENCOUNTER — Telehealth: Payer: Self-pay | Admitting: Neurology

## 2017-03-24 DIAGNOSIS — J986 Disorders of diaphragm: Secondary | ICD-10-CM

## 2017-03-24 DIAGNOSIS — G990 Autonomic neuropathy in diseases classified elsewhere: Secondary | ICD-10-CM

## 2017-03-24 DIAGNOSIS — E669 Obesity, unspecified: Secondary | ICD-10-CM

## 2017-03-24 DIAGNOSIS — G908 Other disorders of autonomic nervous system: Secondary | ICD-10-CM

## 2017-03-24 DIAGNOSIS — M359 Systemic involvement of connective tissue, unspecified: Secondary | ICD-10-CM

## 2017-03-24 NOTE — Procedures (Signed)
NAME:    Edwin Lucas, [name]                                                     DOB: 10/17/1965 MEDICAL RECORD No:   [id number]                                       DOS: 03/23/2017 REFERRING PHYSICIAN: Creola CornJohn Russo, MD  STUDY PERFORMED: Home Sleep Study- W[facility]PAT  HISTORY: Chief complaint according to patient: "I am having trouble breathing, developed phrenic nerve palsy - diaphragmatic weakness- beginning 14 years ago." I also have vitiligo /autoimmune conditions". Epworth Sleepiness Score endorsed at 9/24 points, Fatigue severity score at 45 points, BMI 36.5. Repeat sleep study after previous HST by Apnea link from 03-08-2017 indicated conflicting data.      STUDY RESULTS:  Total Recording Time: 8 hrs. 55 minutes, Total Sleep Time 6 hrs. and 55 min.  Total Apnea/Hypopnea Index (AHI):   71.1 /hr.; RDI 71.3/hr. Average Oxygen Saturation (SpO2):  89 %; Lowest Oxygen Saturation: 77 %  Time Oxygen Saturation Below 89%: 27% Average Heart Rate: 82 bpm (between 134 and 41)  IMPRESSION:  Severe Sleep Apnea, Severe Sleep hypoxemia, and cardiac stress reaction manifesting as tachy-brady cardia.  RECOMMENDATION: PAP therapy is needed, given the underlying condition of diaphragmatic weakness. The patient may respond to CPAP, but is likely to require BiPAP. I will request an attended, full night PAP titration to allow change in modalities.  I certify that I have reviewed the raw data recording prior to the issuance of this report in accordance with the standards of the American Academy of Sleep Medicine (AASM). Melvyn Novasarmen Gerrad Welker, MD   03-24-2017 Medical Director of [facility]  Diplomat  of the ABPN and ABSM, accredited by the AASM

## 2017-03-24 NOTE — Telephone Encounter (Signed)
Called the patient and went over the sleep study results.  I advised pt that Dr. Vickey Hugerohmeier reviewed their sleep study results and found that pt severe sleep apnea. Dr. Vickey Hugerohmeier recommends that pt comes into our sleep lab for a CPAP titration. I informed the patient that once insurance approves this test then we will contact him for bring him in for the study. Pt verbalized understanding of results. Pt had no questions at this time but was encouraged to call back if questions arise.

## 2017-03-24 NOTE — Addendum Note (Signed)
Addended by: Melvyn NovasHMEIER, Lerlene Treadwell on: 03/24/2017 08:31 AM   Modules accepted: Orders

## 2017-03-30 ENCOUNTER — Telehealth: Payer: Self-pay | Admitting: Neurology

## 2017-03-30 NOTE — Telephone Encounter (Signed)
[employer] denied CPAP Titration suggest Auto pap °

## 2017-03-30 NOTE — Telephone Encounter (Signed)
This patient has a phrenic nerve palsy , paralyzing his diaphragm - Auto CPAP is not safe in this comorbidity!!! Plus sleep hypoxemia- he mayneed oxygen, HTN, Ventral hernia,  Orthopnea.  DID [employer] review his chart ????

## 2017-03-31 NOTE — Telephone Encounter (Signed)
Great thank you!

## 2017-03-31 NOTE — Telephone Encounter (Signed)
Its approved. We will get him scheduled

## 2017-04-03 ENCOUNTER — Ambulatory Visit (INDEPENDENT_AMBULATORY_CARE_PROVIDER_SITE_OTHER): Payer: BLUE CROSS/BLUE SHIELD | Admitting: Neurology

## 2017-04-03 DIAGNOSIS — G4733 Obstructive sleep apnea (adult) (pediatric): Secondary | ICD-10-CM

## 2017-04-03 DIAGNOSIS — J986 Disorders of diaphragm: Secondary | ICD-10-CM

## 2017-04-03 DIAGNOSIS — G990 Autonomic neuropathy in diseases classified elsewhere: Secondary | ICD-10-CM

## 2017-04-03 DIAGNOSIS — E669 Obesity, unspecified: Secondary | ICD-10-CM

## 2017-04-03 DIAGNOSIS — G908 Other disorders of autonomic nervous system: Secondary | ICD-10-CM

## 2017-04-03 DIAGNOSIS — G9089 Other disorders of autonomic nervous system: Secondary | ICD-10-CM

## 2017-04-03 DIAGNOSIS — M359 Systemic involvement of connective tissue, unspecified: Secondary | ICD-10-CM

## 2017-04-07 ENCOUNTER — Telehealth: Payer: Self-pay | Admitting: Neurology

## 2017-04-07 ENCOUNTER — Other Ambulatory Visit: Payer: Self-pay | Admitting: Neurology

## 2017-04-07 DIAGNOSIS — R0602 Shortness of breath: Secondary | ICD-10-CM

## 2017-04-07 DIAGNOSIS — I10 Essential (primary) hypertension: Secondary | ICD-10-CM

## 2017-04-07 DIAGNOSIS — G4730 Sleep apnea, unspecified: Secondary | ICD-10-CM

## 2017-04-07 DIAGNOSIS — G5680 Other specified mononeuropathies of unspecified upper limb: Secondary | ICD-10-CM

## 2017-04-07 DIAGNOSIS — G588 Other specified mononeuropathies: Secondary | ICD-10-CM

## 2017-04-07 DIAGNOSIS — G4733 Obstructive sleep apnea (adult) (pediatric): Secondary | ICD-10-CM

## 2017-04-07 DIAGNOSIS — G4710 Hypersomnia, unspecified: Secondary | ICD-10-CM

## 2017-04-07 DIAGNOSIS — G4734 Idiopathic sleep related nonobstructive alveolar hypoventilation: Secondary | ICD-10-CM

## 2017-04-07 NOTE — Telephone Encounter (Signed)
I called pt. I advised pt that Dr. Vickey Hugerohmeier reviewed their sleep study results and found that pt has sleep apnea. Dr. Vickey Hugerohmeier recommends that pt starts CPAP. I reviewed PAP compliance expectations with the pt. Pt is agreeable to starting a CPAP. I advised pt that an order will be sent to a DME, [facility], and [facility] will call the pt within about one week after they file with the pt's insurance. [facility] will show the pt how to use the machine, fit for masks, and troubleshoot the CPAP if needed. A follow up appt was made for insurance purposes with Fredrik CoveRobin Hyler on Jun 09, 2017 at 11:30 am. Pt verbalized understanding to arrive 15 minutes early and bring their CPAP. A letter with all of this information in it will be mailed to the pt as a reminder. I verified with the pt that the address we have on file is correct. Pt verbalized understanding of results. Pt had no questions at this time but was encouraged to call back if questions arise.

## 2017-04-07 NOTE — Procedures (Signed)
PATIENT'S NAME:  Edwin Lucas, Edwin C. DOB:                   03-31-1966      MR#:    [id number]     DATE OF RECORDING: 04/03/2017 REFERRING M.D.:  Creola CornJohn Russo MD Study Performed:   Titration to CPAP HISTORY:  This 51 year old male patient of Dr. Ferd Hibbsusso's returned after a HST from 03-08-2017 documented the following: Total Recording Time: 8 hrs. 55 minutes, Total Sleep Time 6 hrs. 55 min.  Total Apnea/Hypopnea Index (AHI):   71.1 /hr.; RDI 71.3/hr. Average Oxygen Saturation (SpO2):  89 %; Lowest Oxygen Saturation: 77 %  Time Oxygen Saturation Below 89%: 25 minutes= 27% Average Heart Rate: 82 bpm (between 134 and 41 bpm)   IMPRESSION: Severe Sleep Apnea, Severe Sleep hypoxemia, Snoring, and cardiac stress reaction manifesting as tachy-brady cardia.  The patient's weight 285 pounds with a height of 74 (inches), resulting in a BMI of 36.5 kg/m2. The patient's neck circumference measured 18 inches.  CURRENT MEDICATIONS: Lipitor, Lisinopril, Effexor,  PROCEDURE:  This is a multichannel digital polysomnogram utilizing the SomnoStar 11.2 system.  Electrodes and sensors were applied and monitored per AASM Specifications.   EEG, EOG, Chin and Limb EMG, were sampled at 200 Hz.  ECG, Snore and Nasal Pressure, Thermal Airflow, Respiratory Effort, CPAP Flow and Pressure, Oximetry was sampled at 50 Hz. Digital video and audio were recorded.      CPAP was initiated at 6 cmH20 with heated humidity per AASM split night standards and pressure was advanced to 14 cmH20 because of hypopneas, apneas and desaturations.  At a PAP pressure of 14 cmH20, there was a reduction of the AHI to 0.0 with improvement of the above symptoms of obstructive sleep apnea.   Lights Out was at 21:25 and Lights On at 03:30. Total recording time (TRT) was 364 minutes, with a total sleep time (TST) of 234 minutes. The patient's sleep latency was 76 minutes with 35 minutes of wake time after sleep onset. REM latency was 218.5 minutes. The sleep  efficiency was 64.3 %.    SLEEP ARCHITECTURE: WASO (Wake after sleep onset) was 35 minutes.  There were 42.5 minutes in Stage N1, 125 minutes Stage N2, 50.5 minutes Stage N3 and 16 minutes in Stage REM.  The percentage of Stage N1 was 18.2%, Stage N2 was 53.4%, Stage N3 was 21.6% and Stage R (REM sleep) was 6.8%.   RESPIRATORY ANALYSIS:  There was a total of 21 respiratory events: 0 obstructive apneas, 0 central apneas and 0 mixed apneas with a total of 0 apneas and an apnea index (AI) of 0 /hour. There were 21 hypopneas with a hypopnea index of 5.4/hour. The patient also had 0 respiratory event related arousals (RERAs). The total APNEA/HYPOPNEA INDEX (AHI) was 5.4 /hour and the total RESPIRATORY DISTURBANCE INDEX was 5.4/hr.  5 events occurred in REM sleep and 16 events in NREM. The REM AHI was 18.8 /hr. versus a non-REM AHI of 4.4 /hr.  The patient spent 37 minutes of total sleep time in the supine position and 197 minutes in non-supine. The supine AHI was 9.7/hr., versus a non-supine AHI of 4.6/hr.  OXYGEN SATURATION & C02:  The baseline 02 saturation was 91%, with the lowest being 84%. Time spent below 89% saturation equaled 15 minutes.  PERIODIC LIMB MOVEMENTS:    The patient had a total of 27 Periodic Limb Movements. The Periodic Limb Movement (PLM) index was 6.9 and the  PLM Arousal index was 0 /hour. The arousals were noted as: 1 was spontaneous, 0 were associated with PLMs, and 3 were associated with respiratory events.  Audio and video analysis did not show any abnormal or unusual movements, behaviors, phonations or vocalizations.  The patient took one bathroom break at 3 AM. EKG was in keeping with normal sinus rhythm (NSR) with frequent PVCs. Post-study, the patient indicated that sleep was the same as usual.   DIAGNOSIS Obstructive Sleep Apnea, responding well to 14 cm water CPAP with 2 cm EPR. The patient was fitted with an AirFit FFM F 20 in medium. Heated humidity was added.    PLANS/RECOMMENDATIONS:  A follow up appointment will be scheduled within 60-90 days in the Sleep Clinic [facility].   Please call [phone] with any questions.      I certify that I have reviewed the entire raw data recording prior to the issuance of this report in accordance with the Standards of Accreditation of the American Academy of Sleep Medicine (AASM)    Melvyn Novasarmen Daiana Vitiello, M.D.  04-07-2017  Diplomat, American Board of Psychiatry and Neurology  Diplomat, American Board of Sleep Medicine Medical Director, [facility]

## 2017-04-07 NOTE — Telephone Encounter (Signed)
-----   Message from Melvyn Novasarmen Dohmeier, MD sent at 04/07/2017  5:19 PM EST ----- Good resolution of severe sleep apnea under CPAP 14 cm water pressure with 2 cm H20 EPR. The patient was fitted with an AirFit FFM F 20 in  medium. Heated humidity was added.

## 2017-04-18 DIAGNOSIS — G4733 Obstructive sleep apnea (adult) (pediatric): Secondary | ICD-10-CM | POA: Diagnosis not present

## 2017-05-03 ENCOUNTER — Ambulatory Visit: Payer: BLUE CROSS/BLUE SHIELD | Admitting: Cardiology

## 2017-05-04 ENCOUNTER — Ambulatory Visit: Payer: BLUE CROSS/BLUE SHIELD | Admitting: Cardiology

## 2017-05-05 ENCOUNTER — Encounter: Payer: Self-pay | Admitting: Cardiology

## 2017-05-18 DIAGNOSIS — G4733 Obstructive sleep apnea (adult) (pediatric): Secondary | ICD-10-CM | POA: Diagnosis not present

## 2017-05-26 ENCOUNTER — Encounter: Payer: Self-pay | Admitting: Cardiology

## 2017-05-26 ENCOUNTER — Ambulatory Visit (INDEPENDENT_AMBULATORY_CARE_PROVIDER_SITE_OTHER): Payer: BLUE CROSS/BLUE SHIELD | Admitting: Cardiology

## 2017-05-26 VITALS — BP 132/78 | HR 76 | Ht 74.0 in | Wt 280.1 lb

## 2017-05-26 DIAGNOSIS — I10 Essential (primary) hypertension: Secondary | ICD-10-CM

## 2017-05-26 DIAGNOSIS — G4734 Idiopathic sleep related nonobstructive alveolar hypoventilation: Secondary | ICD-10-CM

## 2017-05-26 DIAGNOSIS — E782 Mixed hyperlipidemia: Secondary | ICD-10-CM | POA: Diagnosis not present

## 2017-05-26 NOTE — Progress Notes (Signed)
Cardiology Office Note:    Date:  05/26/2017   ID:  Edwin Lucas, DOB 09/16/1965, MRN [id number]  PCP:  Creola Cornusso, John, MD  Cardiologist:  Garwin Brothersajan R Tomesha Sargent, MD   Referring MD: Creola Cornusso, John, MD    ASSESSMENT:    1. Essential hypertension   2. Hypoxia, sleep related   3. Mixed dyslipidemia    PLAN:    In order of problems listed above:  1. Primary prevention stressed with the patient.  Importance of compliance with diet and medications stressed and he vocalized understanding.  His blood pressure is stable. 2. Sleep health issues were discussed 3. I also discussed lipids from the last evaluation.  Importance of regular exercise stressed weight reduction stressed he will be seen in follow-up appointment in 6 months or earlier if he has any concerns.   Medication Adjustments/Labs and Tests Ordered: Current medicines are reviewed at length with the patient today.  Concerns regarding medicines are outlined above.  No orders of the defined types were placed in this encounter.  No orders of the defined types were placed in this encounter.    Chief Complaint  Patient presents with  . Follow-up    3 month f/u     History of Present Illness:    Edwin Lucas is a 52 y.o. male patient.  He was evaluated for shortness of breath and essential hypertension.  Subsequently his evaluation was unremarkable.  He mentions to me that he was evaluated positive for significant sleep apnea and now is under treatment with sleep apnea precaution at night.  No chest pain orthopnea or PND.  He is very active in his diet and exercise regularly to lose weight successfully.  He mentions to me with all these above measures he feels markedly better.  Past Medical History:  Diagnosis Date  . Anxiety   . Hernia    abdominal wall  . History of kidney stones   . Hyperlipidemia   . Hypertension    does not see a cardiologist  . Phrenic nerve palsy    left side    Past Surgical History:  Procedure  Laterality Date  . CYSTOSTOMY W/ STENT INSERTION    . HERNIA REPAIR  2011   umbilical  . INSERTION OF MESH N/A 03/15/2013   Procedure: INSERTION OF MESH;  Surgeon: Liz MaladyBurke E Thompson, MD;  Location: [facility] OR;  Service: General;  Laterality: N/A;  . LITHOTRIPSY    . VENTRAL HERNIA REPAIR  03/15/2013   Dr Janee Mornhompson  . VENTRAL HERNIA REPAIR N/A 03/15/2013   Procedure: LAPAROSCOPIC VENTRAL HERNIA;  Surgeon: Liz MaladyBurke E Thompson, MD;  Location: [facility] OR;  Service: General;  Laterality: N/A;    Current Medications: Current Meds  Medication Sig  . atorvastatin (LIPITOR) 40 MG tablet Take 40 mg by mouth daily.  . colchicine 0.6 MG tablet Take 1 tablet by mouth 2 (two) times daily as needed.  Marland Kitchen. lisinopril (PRINIVIL,ZESTRIL) 20 MG tablet Take 20 mg by mouth daily.  . predniSONE (STERAPRED UNI-PAK 21 TAB) 10 MG (21) TBPK tablet as needed.  . venlafaxine XR (EFFEXOR-XR) 150 MG 24 hr capsule Take 1 capsule by mouth daily.     Allergies:   Patient has no known allergies.   Social History   Socioeconomic History  . Marital status: Married    Spouse name: None  . Number of children: None  . Years of education: None  . Highest education level: None  Social Needs  . Financial resource strain: None  .  Food insecurity - worry: None  . Food insecurity - inability: None  . Transportation needs - medical: None  . Transportation needs - non-medical: None  Occupational History  . None  Tobacco Use  . Smoking status: Former Smoker    Packs/day: 1.00    Years: 10.00    Pack years: 10.00    Last attempt to quit: 02/16/1992    Years since quitting: 25.2  . Smokeless tobacco: Former Neurosurgeon    Types: Snuff  Substance and Sexual Activity  . Alcohol use: Yes    Alcohol/week: 8.4 oz    Types: 14 Glasses of wine per week    Comment: 2-3 glasses of wine every evening, beer  . Drug use: No  . Sexual activity: None  Other Topics Concern  . None  Social History Narrative  . None     Family History: The  patient's family history includes Cancer in his mother; Heart disease in his father.  ROS:   Please see the history of present illness.    All other systems reviewed and are negative.  EKGs/Labs/Other Studies Reviewed:    The following studies were reviewed today: I reviewed stress test report with the patient at extensive length and also echocardiogram and discussed findings and questions were answered to his satisfaction.   Recent Labs: No results found for requested labs within last 8760 hours.  Recent Lipid Panel No results found for: CHOL, TRIG, HDL, CHOLHDL, VLDL, LDLCALC, LDLDIRECT  Physical Exam:    VS:  BP 132/78 (BP Location: Left Arm, Patient Position: Sitting)   Pulse 76   Ht 6\' 2"  (1.88 m)   Wt 280 lb 1.3 oz (127 kg)   SpO2 96%   BMI 35.96 kg/m     Wt Readings from Last 3 Encounters:  05/26/17 280 lb 1.3 oz (127 kg)  02/02/17 285 lb (129.3 kg)  02/01/17 288 lb 12.8 oz (131 kg)     GEN: Patient is in no acute distress HEENT: Normal NECK: No JVD; No carotid bruits LYMPHATICS: No lymphadenopathy CARDIAC: Hear sounds regular, 2/6 systolic murmur at the apex. RESPIRATORY:  Clear to auscultation without rales, wheezing or rhonchi  ABDOMEN: Soft, non-tender, non-distended MUSCULOSKELETAL:  No edema; No deformity  SKIN: Warm and dry NEUROLOGIC:  Alert and oriented x 3 PSYCHIATRIC:  Normal affect   Signed, Garwin Brothers, MD  05/26/2017 12:08 PM    [facility]

## 2017-05-26 NOTE — Patient Instructions (Signed)
Medication Instructions:  Your physician recommends that you continue on your current medications as directed. Please refer to the Current Medication list given to you today.  Labwork: None  Testing/Procedures: None  Follow-Up: Your physician recommends that you schedule a follow-up appointment in: 6 months  Any Other Special Instructions Will Be Listed Below (If Applicable).     If you need a refill on your cardiac medications before your next appointment, please call your pharmacy.   [facility]  Renesha Lizama A, RN, BSN  

## 2017-06-09 ENCOUNTER — Ambulatory Visit: Payer: Self-pay

## 2017-06-18 DIAGNOSIS — G4733 Obstructive sleep apnea (adult) (pediatric): Secondary | ICD-10-CM | POA: Diagnosis not present

## 2017-07-11 ENCOUNTER — Encounter: Payer: Self-pay | Admitting: Neurology

## 2017-07-11 ENCOUNTER — Ambulatory Visit: Payer: BLUE CROSS/BLUE SHIELD | Admitting: Neurology

## 2017-07-11 ENCOUNTER — Encounter (INDEPENDENT_AMBULATORY_CARE_PROVIDER_SITE_OTHER): Payer: Self-pay

## 2017-07-11 VITALS — BP 131/86 | HR 76 | Ht 74.0 in | Wt 283.5 lb

## 2017-07-11 DIAGNOSIS — Z9989 Dependence on other enabling machines and devices: Secondary | ICD-10-CM | POA: Diagnosis not present

## 2017-07-11 DIAGNOSIS — E669 Obesity, unspecified: Secondary | ICD-10-CM | POA: Diagnosis not present

## 2017-07-11 DIAGNOSIS — J986 Disorders of diaphragm: Secondary | ICD-10-CM

## 2017-07-11 DIAGNOSIS — G4733 Obstructive sleep apnea (adult) (pediatric): Secondary | ICD-10-CM

## 2017-07-11 NOTE — Progress Notes (Signed)
SLEEP MEDICINE CLINIC   Provider:  Melvyn Novas, M D  Primary Care Physician:  Creola Corn, MD   Referring Provider: Creola Corn, MD    Chief Complaint  Patient presents with  . Follow-up    Patient reports that he is doing very well with the cpap    HPI:  Edwin Lucas is a 52 y.o. male , seen here as in a referral  from Dr. Timothy Lasso for sleep evaluation,  Chief complaint according to patient : " I am having trouble breathing, phrenic nerve palsy - diaphragmatic weakness- beginning 14 years ago." I am fatigued.  I have vitiligo autoimmune conditions.  Sleep habits are as follows: Edwin Lucas usually watches TV for the last hour or so before he retreats to bed. He reports that when he comes home from work after 5 PM he would be able easily to fall asleep if in a comfortable position. He does not nap regularly. Bedtime for him is usually around 10:30 PM, his bedroom is cool quiet and dark and she does not use any screen device or TV in the bedroom. He sleeps usually on his side, on only one pillow. The bedroom is not shared, he snores and his wife sleeps elsewhere. He will have usually one bathroom break around 3:30 AM. Edwin Lucas usually wakes up before his alarm rings, around 6;50.with a very dry mouth. Alarm rings at 7.AM  He feels refreshed after his shower, leaves the house by 8.30.    Sleep medical history and family sleep history:  Father was a snorer, died of CHF.  Tonsillectomy- none. Deviated septum and asymetric tonsillary enlargement- but no surgery.  No sleep walking history. Had night terrors.  Social history:  No shift work history.  I have the pleasure of seeing Edwin Lucas today on 11 July 2017 and a revisit.  Edwin Lucas underwent a home sleep test on 08 March 2017 at the first that he attempt was to short the total recording time only amounted to about 100 minutes, the patient then repeated the home sleep test on the 16th of the same month with a AHI result of  71.1/h, RDI 71.3/h oxygen nadir of 77% SPO2, time and oxygen desaturation was 25 minutes average heart rate 82 bpm.  For this reason the patient was asked to return for an attended sleep study.   This was then performed on 03 April 2017 as he was titrated to CPAP and responded well to 14 cmH2O pressure- at this pressure his AHI was reduced to 0.0 but he still had a rather low sleep efficiency.  He did no longer have prolonged desaturations the total time spent below 89% desaturation equal to 15 minutes.   He had infrequent periodic limb movements.  The patient received a CPAP set to 14 cmH2O pressure was 2 cm EPR and was fitted with an air fit full facemask ( ResMed F 20 medium sized), heated humidity was added to CPAP. DME is [facility].  Compliance report for the months between 15 January and 07 July 2017 shows 100% compliance by days, 29 of those days over 4 hours of continuous use.  Average use of time 8 hours and 6 minutes.  CPAP is set to a pressure of 14 cmH2O was 2 cm EPR, residual apnea-hypopnea index is 0.4/h of use.  No central apneas are emerging, there are very minor air leaks noted.  Review of Systems: Out of a complete 14 system review, the patient complains of only  the following symptoms, and all other reviewed systems are negative.   The patient now has 1 nocturia per night or none, he has a significant reduction in sleepiness, fatigue and on CPAP is no longer snoring.  There is a clear benefit to using CPAP compliantly.  Epworth score  Is now on CPAP at 2 points from 9 before , Fatigue severity score is now  22 from 45  , depression score n/a   Social History   Socioeconomic History  . Marital status: Married    Spouse name: Not on file  . Number of children: Not on file  . Years of education: Not on file  . Highest education level: Not on file  Social Needs  . Financial resource strain: Not on file  . Food insecurity - worry: Not on file  . Food insecurity -  inability: Not on file  . Transportation needs - medical: Not on file  . Transportation needs - non-medical: Not on file  Occupational History  . Not on file  Tobacco Use  . Smoking status: Former Smoker    Packs/day: 1.00    Years: 10.00    Pack years: 10.00    Last attempt to quit: 02/16/1992    Years since quitting: 25.4  . Smokeless tobacco: Former Neurosurgeon    Types: Snuff  Substance and Sexual Activity  . Alcohol use: Yes    Alcohol/week: 8.4 oz    Types: 14 Glasses of wine per week    Comment: 2-3 glasses of wine every evening, beer  . Drug use: No  . Sexual activity: Not on file  Other Topics Concern  . Not on file  Social History Narrative  . Not on file    Family History  Problem Relation Age of Onset  . Cancer Mother        breast  . Heart disease Father        CHF    Past Medical History:  Diagnosis Date  . Anxiety   . Hernia    abdominal wall  . History of kidney stones   . Hyperlipidemia   . Hypertension    does not see a cardiologist  . Phrenic nerve palsy    left side    Past Surgical History:  Procedure Laterality Date  . CYSTOSTOMY W/ STENT INSERTION    . HERNIA REPAIR  2011   umbilical  . INSERTION OF MESH N/A 03/15/2013   Procedure: INSERTION OF MESH;  Surgeon: Liz Malady, MD;  Location: [facility] OR;  Service: General;  Laterality: N/A;  . LITHOTRIPSY    . VENTRAL HERNIA REPAIR  03/15/2013   Dr Janee Morn  . VENTRAL HERNIA REPAIR N/A 03/15/2013   Procedure: LAPAROSCOPIC VENTRAL HERNIA;  Surgeon: Liz Malady, MD;  Location: [facility] OR;  Service: General;  Laterality: N/A;    Current Outpatient Medications  Medication Sig Dispense Refill  . atorvastatin (LIPITOR) 40 MG tablet Take 40 mg by mouth daily.  3  . colchicine 0.6 MG tablet Take 1 tablet by mouth 2 (two) times daily as needed.  2  . lisinopril (PRINIVIL,ZESTRIL) 20 MG tablet Take 20 mg by mouth daily.    . predniSONE (STERAPRED UNI-PAK 21 TAB) 10 MG (21) TBPK tablet as needed.  0    . venlafaxine XR (EFFEXOR-XR) 150 MG 24 hr capsule Take 1 capsule by mouth daily.  6   No current facility-administered medications for this visit.     Allergies as of 07/11/2017  . (  No Known Allergies)    Vitals: BP 131/86   Pulse 76   Ht 6\' 2"  (1.88 m)   Wt 283 lb 8 oz (128.6 kg)   BMI 36.40 kg/m  Last Weight:  Wt Readings from Last 1 Encounters:  07/11/17 283 lb 8 oz (128.6 kg)   ZOX:WRUE mass index is 36.4 kg/m.     Last Height:   Ht Readings from Last 1 Encounters:  07/11/17 6\' 2"  (1.88 m)    Physical exam:  General: The patient is awake, alert and appears not in acute distress. The patient is well groomed. Head: Normocephalic, atraumatic. Neck is supple. Mallampati 1- asymmetric tonsils. ,  neck circumference 18. Nasal airflow patent  Retrognathia is not seen.  He has a crowded lower dental status.  Cardiovascular:  Regular rate and rhythm , without  murmurs or carotid bruit. Respiratory: Lungs are clear to auscultation. He is short of breath with a  RR of17/min.  Skin:  Without evidence of edema- has vitiligo  Trunk: BMI is 37- abdominal girth is main obesity . The patient's posture is erect  Neurologic exam : The patient is awake and alert, oriented to place and time.  He has mild dysphonia.    Mood and affect are appropriate.  Cranial nerves:Pupils are equal and briskly reactive to light.  Full EOM. Facial sensation intact to fine touch. Facial motor strength is symmetric and tongue and uvula move midline. Shoulder shrug was symmetrical.  Motor exam: reduced  tone, poor muscle bulk but  symmetric strength in all extremities. Sensory:  Fine touch, pinprick and vibration were normal. Coordination: Finger-to-nose maneuver  normal without evidence of ataxia, dysmetria or tremor. Gait and station: Patient walks without assistive device. Deep tendon reflexes: in the  upper and lower extremities are symmetric and intact.   Assessment:  After physical and neurologic  examination, review of laboratory studies,  Personal review of imaging studies, reports of other /same  Imaging studies, results of polysomnography and / or neurophysiology testing and pre-existing records as far as provided in visit., my assessment is   1) diagnosis of severe OSA in the setting of diaphragmatic weakness.  Borderline hypoxemia.  Edwin Lucas reports shortness of breath, and periodic paralyzation of his phrenic nerve causing his diaphragm to work in sufficiently.  He has now been diagnosed with rather severe degree of obstructive sleep apnea and responded beautifully to continuous positive airway pressure.  He reports avoiding the supine sleep position, if he inadvertently finds himself in supine his CPAP pressure seems not to be sufficient to keep his airway open and he noted air leaks.Marland Kitchen   2)His BMI at this time qualifies as moderate obesity.  The patient has lost 17 pounds since I last saw him in consultation.     The patient was advised of the nature of the diagnosed disorder , the treatment options and the  risks for general health and wellness arising from not treating the condition.   I spent more than  45  minutes of face to face time with the patient.  Greater than 50% of time was spent in counseling and coordination of care. We have discussed the diagnosis and differential and I answered the patient's questions.    Plan:  He has noted improvement in shortness of breath, he does not wake up air hungry on CPAP, he feels rested and refreshed in the morning less sleepy and less fatigued.  There is no nocturia and no morning headaches noted. No longer  naps in daytime !! Treatment plan and additional workup : continue CPAP- we can offer him to set his machine to auto-titration if he feels the pressure should become too high. I will change to autotitration with 2 cm EPR in case of aerophagia as well.     Rv in 12 month.    Melvyn NovasARMEN Bobbie Virden, MD 07/11/2017, 8:53 AM  Certified in  Neurology by ABPN Certified in Sleep Medicine by [facility]  [facility] [address], [address] [place], [place] [id number]

## 2017-07-19 DIAGNOSIS — G4733 Obstructive sleep apnea (adult) (pediatric): Secondary | ICD-10-CM | POA: Diagnosis not present

## 2017-08-04 ENCOUNTER — Encounter (INDEPENDENT_AMBULATORY_CARE_PROVIDER_SITE_OTHER): Payer: BLUE CROSS/BLUE SHIELD

## 2017-08-16 DIAGNOSIS — G4733 Obstructive sleep apnea (adult) (pediatric): Secondary | ICD-10-CM | POA: Diagnosis not present

## 2017-08-17 ENCOUNTER — Ambulatory Visit (INDEPENDENT_AMBULATORY_CARE_PROVIDER_SITE_OTHER): Payer: BLUE CROSS/BLUE SHIELD | Admitting: Family Medicine

## 2017-08-17 ENCOUNTER — Encounter (INDEPENDENT_AMBULATORY_CARE_PROVIDER_SITE_OTHER): Payer: Self-pay

## 2017-08-24 ENCOUNTER — Ambulatory Visit (INDEPENDENT_AMBULATORY_CARE_PROVIDER_SITE_OTHER): Payer: BLUE CROSS/BLUE SHIELD | Admitting: Family Medicine

## 2017-08-24 ENCOUNTER — Encounter (INDEPENDENT_AMBULATORY_CARE_PROVIDER_SITE_OTHER): Payer: Self-pay

## 2017-10-16 DIAGNOSIS — G4733 Obstructive sleep apnea (adult) (pediatric): Secondary | ICD-10-CM | POA: Diagnosis not present

## 2017-12-12 DIAGNOSIS — G4733 Obstructive sleep apnea (adult) (pediatric): Secondary | ICD-10-CM | POA: Diagnosis not present

## 2018-01-12 DIAGNOSIS — G4733 Obstructive sleep apnea (adult) (pediatric): Secondary | ICD-10-CM | POA: Diagnosis not present

## 2018-02-12 DIAGNOSIS — G4733 Obstructive sleep apnea (adult) (pediatric): Secondary | ICD-10-CM | POA: Diagnosis not present

## 2018-02-14 DIAGNOSIS — I10 Essential (primary) hypertension: Secondary | ICD-10-CM | POA: Diagnosis not present

## 2018-02-14 DIAGNOSIS — R0609 Other forms of dyspnea: Secondary | ICD-10-CM | POA: Diagnosis not present

## 2018-02-14 DIAGNOSIS — R05 Cough: Secondary | ICD-10-CM | POA: Diagnosis not present

## 2018-02-14 DIAGNOSIS — J181 Lobar pneumonia, unspecified organism: Secondary | ICD-10-CM | POA: Diagnosis not present

## 2018-03-17 DIAGNOSIS — G4733 Obstructive sleep apnea (adult) (pediatric): Secondary | ICD-10-CM | POA: Diagnosis not present

## 2018-05-05 DIAGNOSIS — G4733 Obstructive sleep apnea (adult) (pediatric): Secondary | ICD-10-CM | POA: Diagnosis not present

## 2018-06-05 DIAGNOSIS — G4733 Obstructive sleep apnea (adult) (pediatric): Secondary | ICD-10-CM | POA: Diagnosis not present

## 2018-06-14 DIAGNOSIS — I10 Essential (primary) hypertension: Secondary | ICD-10-CM | POA: Diagnosis not present

## 2018-06-14 DIAGNOSIS — F411 Generalized anxiety disorder: Secondary | ICD-10-CM | POA: Diagnosis not present

## 2018-06-14 DIAGNOSIS — R739 Hyperglycemia, unspecified: Secondary | ICD-10-CM | POA: Diagnosis not present

## 2018-06-14 DIAGNOSIS — M109 Gout, unspecified: Secondary | ICD-10-CM | POA: Diagnosis not present

## 2018-07-09 ENCOUNTER — Encounter: Payer: Self-pay | Admitting: Neurology

## 2018-07-11 ENCOUNTER — Encounter: Payer: Self-pay | Admitting: Neurology

## 2018-07-11 ENCOUNTER — Ambulatory Visit: Payer: BLUE CROSS/BLUE SHIELD | Admitting: Neurology

## 2018-07-11 VITALS — BP 123/84 | HR 68 | Ht 74.0 in | Wt 286.0 lb

## 2018-07-11 DIAGNOSIS — I10 Essential (primary) hypertension: Secondary | ICD-10-CM | POA: Diagnosis not present

## 2018-07-11 DIAGNOSIS — G4734 Idiopathic sleep related nonobstructive alveolar hypoventilation: Secondary | ICD-10-CM

## 2018-07-11 DIAGNOSIS — J986 Disorders of diaphragm: Secondary | ICD-10-CM

## 2018-07-11 DIAGNOSIS — G5680 Other specified mononeuropathies of unspecified upper limb: Secondary | ICD-10-CM

## 2018-07-11 DIAGNOSIS — G588 Other specified mononeuropathies: Secondary | ICD-10-CM

## 2018-07-11 NOTE — Progress Notes (Signed)
SLEEP MEDICINE CLINIC   Provider:  Melvyn Novasarmen  Larra Crunkleton, M D  Primary Care Physician:  Creola Cornusso, John, MD   Referring Provider: Creola Cornusso, John, MD    Chief Complaint  Patient presents with  . Follow-up    pt alone, rm 10. pt states that machine is working well. He is still having trouble with mask rubbing into his face. would like to discuss another mask option. DME [facility]    HPI:  Edwin Lucas is a 53 y.o. male patient and follows for regular CPAP compliance on 07-11-2018. Edwin Lucas is an established patient who underwent a sleep study in November 2018 and had a follow-up in February of last year.  He shows a 97% compliance with CPAP use with 7 hours and 30 minutes on average nightly use.  CPAP is set at 14 cm water pressure with 2 cm EPR and his AHI is 0.3.  He has received his machine through [facility].  Just over the last 5 days there have been more air leakage has been more air leakage seen, however it has not woken the patient up.  He does report some discomfort with the mask pushing on the bridge of his nose.  He would like to investigate an [place] view or another mask that would cover his mouth but does not cover the nose completely.  He does have facial hair, but the beard is well trimmed.     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  I have vitiligo autoimmune conditions.  Sleep habits are as follows: [name] usually watches TV for the last hour or so before he retreats to bed. He reports that when he comes home from work after 5 PM he would be able easily to fall asleep if in a comfortable position. He does not nap regularly. Bedtime for him is usually around 10:30 PM, his bedroom is cool quiet and dark and she does not use any screen device or TV in the bedroom. He sleeps usually on his side, on only one pillow. The bedroom is not shared, he snores and  his wife sleeps elsewhere. He will have usually one bathroom break around 3:30 AM. [name] usually wakes up before his alarm rings, around 6;50.with a very dry mouth. Alarm rings at 7.AM  He feels refreshed after his shower, leaves the house by 8.30.    Sleep medical history and family sleep history:  Father was a snorer, died of CHF.  Tonsillectomy- none. Deviated septum and asymetric tonsillary enlargement- but no surgery.  No sleep walking history. Had night terrors.  Social history:  No shift work history.  I have the pleasure of seeing Mr. Buen[place]entura today on 11 July 2017 and a revisit.  [name] underwent a home sleep test on 08 March 2017 at the first that he attempt was to short the total recording time only amounted to about 100 minutes, the patient then repeated the home sleep test on the 16th of the same month with a AHI result of 71.1/h, RDI 71.3/h oxygen nadir of 77% SPO2, time and oxygen desaturation was 25 minutes average heart rate 82 bpm.  For this reason the patient was asked to return for an attended sleep study.   This was then performed on 03 April 2017 as he was titrated to CPAP and responded well to 14 cmH2O pressure- at this pressure his  AHI was reduced to 0.0 but he still had a rather low sleep efficiency.  He did no longer have prolonged desaturations the total time spent below 89% desaturation equal to 15 minutes.   He had infrequent periodic limb movements.  The patient received a CPAP set to 14 cmH2O pressure was 2 cm EPR and was fitted with an air fit full facemask ( ResMed F 20 medium sized), heated humidity was added to CPAP. DME is [facility].  Compliance report for the months between 15 January and 07 July 2017 shows 100% compliance by days, 29 of those days over 4 hours of continuous use.  Average use of time 8 hours and 6 minutes.  CPAP is set to a pressure of 14 cmH2O was 2 cm EPR, residual apnea-hypopnea index is 0.4/h of use.  No central apneas are  emerging, there are very minor air leaks noted.  Review of Systems: Out of a complete 14 system review, the patient complains of only the following symptoms, and all other reviewed systems are negative.   The patient now has 1 nocturia per night or none, he has a significant reduction in sleepiness, fatigue and on CPAP is no longer snoring.  There is a clear benefit to using CPAP compliantly.  Epworth score on CPAP at 4 points from 9 before ,  Fatigue severity score is now 20 from 45/ 63 points , depression score n/a.     Social History   Socioeconomic History  . Marital status: Married    Spouse name: Not on file  . Number of children: Not on file  . Years of education: Not on file  . Highest education level: Not on file  Occupational History  . Not on file  Social Needs  . Financial resource strain: Not on file  . Food insecurity:    Worry: Not on file    Inability: Not on file  . Transportation needs:    Medical: Not on file    Non-medical: Not on file  Tobacco Use  . Smoking status: Former Smoker    Packs/day: 1.00    Years: 10.00    Pack years: 10.00    Last attempt to quit: 02/16/1992    Years since quitting: 26.4  . Smokeless tobacco: Former Neurosurgeon    Types: Snuff  Substance and Sexual Activity  . Alcohol use: Yes    Alcohol/week: 14.0 standard drinks    Types: 14 Glasses of wine per week    Comment: 2-3 glasses of wine every evening, beer  . Drug use: No  . Sexual activity: Not on file  Lifestyle  . Physical activity:    Days per week: Not on file    Minutes per session: Not on file  . Stress: Not on file  Relationships  . Social connections:    Talks on phone: Not on file    Gets together: Not on file    Attends religious service: Not on file    Active member of club or organization: Not on file    Attends meetings of clubs or organizations: Not on file    Relationship status: Not on file  . Intimate partner violence:    Fear of current or ex  partner: Not on file    Emotionally abused: Not on file    Physically abused: Not on file    Forced sexual activity: Not on file  Other Topics Concern  . Not on file  Social History Narrative  . Not on  file    Family History  Problem Relation Age of Onset  . Cancer Mother        breast  . Heart disease Father        CHF    Past Medical History:  Diagnosis Date  . Anxiety   . Hernia    abdominal wall  . History of kidney stones   . Hyperlipidemia   . Hypertension    does not see a cardiologist  . Phrenic nerve palsy    left side    Past Surgical History:  Procedure Laterality Date  . CYSTOSTOMY W/ STENT INSERTION    . HERNIA REPAIR  2011   umbilical  . INSERTION OF MESH N/A 03/15/2013   Procedure: INSERTION OF MESH;  Surgeon: Liz MaladyBurke E Thompson, MD;  Location: [facility] OR;  Service: General;  Laterality: N/A;  . LITHOTRIPSY    . VENTRAL HERNIA REPAIR  03/15/2013   Dr Janee Mornhompson  . VENTRAL HERNIA REPAIR N/A 03/15/2013   Procedure: LAPAROSCOPIC VENTRAL HERNIA;  Surgeon: Liz MaladyBurke E Thompson, MD;  Location: [facility] OR;  Service: General;  Laterality: N/A;    Current Outpatient Medications  Medication Sig Dispense Refill  . atorvastatin (LIPITOR) 40 MG tablet Take 40 mg by mouth daily.  3  . colchicine 0.6 MG tablet Take 1 tablet by mouth 2 (two) times daily as needed.  2  . lisinopril (PRINIVIL,ZESTRIL) 20 MG tablet Take 20 mg by mouth daily.    . predniSONE (STERAPRED UNI-PAK 21 TAB) 10 MG (21) TBPK tablet as needed.  0  . venlafaxine XR (EFFEXOR-XR) 150 MG 24 hr capsule Take 1 capsule by mouth daily.  6   No current facility-administered medications for this visit.     Allergies as of 07/11/2018  . (No Known Allergies)    Vitals: BP 123/84   Pulse 68   Ht 6\' 2"  (1.88 m)   Wt 286 lb (129.7 kg)   BMI 36.72 kg/m  Last Weight:  Wt Readings from Last 1 Encounters:  07/11/18 286 lb (129.7 kg)   YQM:VHQIBMI:Body mass index is 36.72 kg/m.     Last Height:   Ht Readings from Last 1  Encounters:  07/11/18 6\' 2"  (1.88 m)    Physical exam:  General: The patient is awake, alert and appears not in acute distress. The patient is well groomed. Head: Normocephalic, atraumatic.  Neck is supple. Mallampati 1- asymmetric tonsils. ,  neck circumference 18". Nasal airflow patent - he has a deviated septum and usually one nasion is obtructed. Retrognathia is not seen.   He has a crowded lower dental status.  Cardiovascular:  Regular rate and rhythm , without  murmurs or carotid bruit. Respiratory: Lungs are clear to auscultation.  Skin:  Without evidence of edema- has vitiligo  Trunk: BMI is 36- abdominal girth is main obesity- he lost 12 pounds since Christmas.The patient's posture is erect  Neurologic exam : The patient is awake and alert, oriented to place and time.  He has mild dysphonia.   Cranial nerves: taste and smell intact -Pupils are equal and briskly reactive to light.  Full EOM. Facial sensation intact to fine touch.  Facial motor strength is symmetric and tongue and uvula move midline.  Shoulder shrug was symmetrical.  Motor exam: normal tone,  muscle bulk and symmetric strength in all extremities.   Assessment:    1) diagnosis of severe OSA in the setting of diaphragmatic weakness.   Borderline hypoxemia.   [name] reports shortness  of breath, and periodic paralyzation of his phrenic nerve causing his diaphragm to work in sufficiently.  He has now been diagnosed with rather severe degree of obstructive sleep apnea and responded beautifully to continuous positive airway pressure.  He reports avoiding the supine sleep position, if he inadvertently finds himself in supine his CPAP pressure seems not to be sufficient to keep his airway open and he noted air leaks.Marland Kitchen   2)His BMI at this time qualifies as moderate obesity. The patient has lost 17 pounds since pre-consultation in 2019 . He lost another 12 pounds since Christmas 2019.      The patient was advised  of the nature of the diagnosed disorder , the treatment options and the  risks for general health and wellness arising from not treating the condition.   I spent more than 30 minutes of face to face time with the patient.  Greater than 50% of time was spent in counseling and coordination of care. We have discussed the diagnosis and differential and I answered the patient's questions.    Plan:  He has noted improvement in shortness of breath, he does not wake up air hungry on CPAP, he feels rested and refreshed in the morning less sleepy and less fatigued.  There is no nocturia and no morning headaches noted. No longer naps in daytime !!   Treatment plan and additional workup : continue CPAP- we can offer him to set his machine to auto-titration if he feels the pressure should become too high. I will change to autotitration with 2 cm EPR in case of aerophagia as well.  He will ask for a new mask that does not push on the bridge of the nose. [facility] order to refit .    Rv in 12 month.    Melvyn Novas, MD 07/11/2018, 10:00 AM  Certified in Neurology by ABPN Certified in Sleep Medicine by [facility] [address] [place], [place] [address]

## 2018-12-06 DIAGNOSIS — M25512 Pain in left shoulder: Secondary | ICD-10-CM | POA: Diagnosis not present

## 2018-12-08 DIAGNOSIS — E7849 Other hyperlipidemia: Secondary | ICD-10-CM | POA: Diagnosis not present

## 2018-12-08 DIAGNOSIS — I10 Essential (primary) hypertension: Secondary | ICD-10-CM | POA: Diagnosis not present

## 2018-12-08 DIAGNOSIS — Z0000 Encounter for general adult medical examination without abnormal findings: Secondary | ICD-10-CM | POA: Diagnosis not present

## 2018-12-08 DIAGNOSIS — R946 Abnormal results of thyroid function studies: Secondary | ICD-10-CM | POA: Diagnosis not present

## 2018-12-08 DIAGNOSIS — R7301 Impaired fasting glucose: Secondary | ICD-10-CM | POA: Diagnosis not present

## 2018-12-08 DIAGNOSIS — M109 Gout, unspecified: Secondary | ICD-10-CM | POA: Diagnosis not present

## 2018-12-14 DIAGNOSIS — I10 Essential (primary) hypertension: Secondary | ICD-10-CM | POA: Diagnosis not present

## 2018-12-14 DIAGNOSIS — R82998 Other abnormal findings in urine: Secondary | ICD-10-CM | POA: Diagnosis not present

## 2018-12-15 DIAGNOSIS — G4733 Obstructive sleep apnea (adult) (pediatric): Secondary | ICD-10-CM | POA: Diagnosis not present

## 2018-12-15 DIAGNOSIS — M1990 Unspecified osteoarthritis, unspecified site: Secondary | ICD-10-CM | POA: Diagnosis not present

## 2018-12-15 DIAGNOSIS — Z0000 Encounter for general adult medical examination without abnormal findings: Secondary | ICD-10-CM | POA: Diagnosis not present

## 2018-12-15 DIAGNOSIS — E669 Obesity, unspecified: Secondary | ICD-10-CM | POA: Diagnosis not present

## 2018-12-15 DIAGNOSIS — Z1331 Encounter for screening for depression: Secondary | ICD-10-CM | POA: Diagnosis not present

## 2018-12-15 DIAGNOSIS — F430 Acute stress reaction: Secondary | ICD-10-CM | POA: Diagnosis not present

## 2018-12-19 ENCOUNTER — Other Ambulatory Visit: Payer: Self-pay

## 2018-12-19 DIAGNOSIS — R6889 Other general symptoms and signs: Secondary | ICD-10-CM | POA: Diagnosis not present

## 2018-12-19 DIAGNOSIS — Z20822 Contact with and (suspected) exposure to covid-19: Secondary | ICD-10-CM

## 2018-12-21 LAB — NOVEL CORONAVIRUS, NAA: SARS-CoV-2, NAA: NOT DETECTED

## 2018-12-29 ENCOUNTER — Other Ambulatory Visit: Payer: Self-pay

## 2018-12-29 DIAGNOSIS — Z20822 Contact with and (suspected) exposure to covid-19: Secondary | ICD-10-CM

## 2018-12-30 LAB — NOVEL CORONAVIRUS, NAA: SARS-CoV-2, NAA: NOT DETECTED

## 2019-03-23 DIAGNOSIS — Z23 Encounter for immunization: Secondary | ICD-10-CM | POA: Diagnosis not present

## 2019-06-28 ENCOUNTER — Ambulatory Visit: Payer: 59 | Attending: Internal Medicine

## 2019-06-28 ENCOUNTER — Other Ambulatory Visit: Payer: Self-pay

## 2019-06-28 DIAGNOSIS — Z20822 Contact with and (suspected) exposure to covid-19: Secondary | ICD-10-CM

## 2019-06-28 DIAGNOSIS — U071 COVID-19: Secondary | ICD-10-CM | POA: Insufficient documentation

## 2019-06-29 LAB — NOVEL CORONAVIRUS, NAA: SARS-CoV-2, NAA: DETECTED — AB

## 2019-06-30 ENCOUNTER — Telehealth: Payer: Self-pay | Admitting: Unknown Physician Specialty

## 2019-06-30 NOTE — Telephone Encounter (Signed)
Called to discuss with Edwin Lucas about Covid symptoms and the use of bamlanivimab, a monoclonal antibody infusion for those with mild to moderate Covid symptoms and at a high risk of hospitalization.     Pt is qualified for this infusion at the [facility] infusion center due to co-morbid conditions and/or a member of an at-risk group, however declines infusion at this time. Symptoms tier reviewed as well as criteria for ending isolation.  Symptoms reviewed that would warrant ED/Hospital evaluation. Preventative practices reviewed. Patient verbalized understanding. Patient advised to call back if he decides that he does want to get infusion. Callback number to the infusion center given. Patient advised to go to Urgent care or ED with severe symptoms. Sx onset Jan 21  Patient Active Problem List   Diagnosis Date Noted  . OSA on CPAP 07/11/2017  . Diaphragmatic disorder 07/11/2017  . Obesity (BMI 30.0-34.9) 07/11/2017  . Hypoxia, sleep related 02/02/2017  . Hypersomnia with sleep apnea 02/02/2017  . Essential hypertension 12/13/2016  . Shortness of breath 12/13/2016  . Phrenic nerve palsy 12/13/2016  . Seroma complicating a procedure 05/02/2013  . Ventral hernia 02/16/2012

## 2019-06-30 NOTE — Telephone Encounter (Signed)
Called to discuss with patient about Covid symptoms and the use of bamlanivimab, a monoclonal antibody infusion for those with mild to moderate Covid symptoms and at a high risk of hospitalization.  Pt is qualified for this infusion at the [facility] infusion center due to BMI>35   Message left to call back  

## 2019-07-02 ENCOUNTER — Other Ambulatory Visit: Payer: Self-pay | Admitting: Unknown Physician Specialty

## 2019-07-02 ENCOUNTER — Telehealth: Payer: Self-pay | Admitting: Unknown Physician Specialty

## 2019-07-02 DIAGNOSIS — E669 Obesity, unspecified: Secondary | ICD-10-CM

## 2019-07-02 DIAGNOSIS — U071 COVID-19: Secondary | ICD-10-CM

## 2019-07-02 NOTE — Telephone Encounter (Signed)
  I connected by phone with Edwin Lucas on 07/02/2019 at 4:20 PM to discuss the potential use of an new treatment for mild to moderate COVID-19 viral infection in non-hospitalized patients.  This patient is a 54 y.o. male that meets the FDA criteria for Emergency Use Authorization of bamlanivimab or casirivimab\imdevimab.  Has a (+) direct SARS-CoV-2 viral test result  Has mild or moderate COVID-19   Is ? 54 years of age and weighs ? 40 kg  Is NOT hospitalized due to COVID-19  Is NOT requiring oxygen therapy or requiring an increase in baseline oxygen flow rate due to COVID-19  Is within 10 days of symptom onset  Has at least one of the high risk factor(s) for progression to severe COVID-19 and/or hospitalization as defined in EUA.  Specific high risk criteria : BMI >/= 35   I have spoken and communicated the following to the patient or parent/caregiver:  1. FDA has authorized the emergency use of bamlanivimab and casirivimab\imdevimab for the treatment of mild to moderate COVID-19 in adults and pediatric patients with positive results of direct SARS-CoV-2 viral testing who are 3 years of age and older weighing at least 40 kg, and who are at high risk for progressing to severe COVID-19 and/or hospitalization.  2. The significant known and potential risks and benefits of bamlanivimab and casirivimab\imdevimab, and the extent to which such potential risks and benefits are unknown.  3. Information on available alternative treatments and the risks and benefits of those alternatives, including clinical trials.  4. Patients treated with bamlanivimab and casirivimab\imdevimab should continue to self-isolate and use infection control measures (e.g., wear mask, isolate, social distance, avoid sharing personal items, clean and disinfect "high touch" surfaces, and frequent handwashing) according to CDC guidelines.   5. The patient or parent/caregiver has the option to accept or refuse  bamlanivimab or casirivimab\imdevimab .  After reviewing this information with the patient, The patient agreed to proceed with receiving the bamlanimivab infusion and will be provided a copy of the Fact sheet prior to receiving the infusion.[name] 07/02/2019 4:20 PM

## 2019-07-03 ENCOUNTER — Other Ambulatory Visit: Payer: Self-pay | Admitting: Internal Medicine

## 2019-07-03 ENCOUNTER — Ambulatory Visit (HOSPITAL_COMMUNITY)
Admission: RE | Admit: 2019-07-03 | Discharge: 2019-07-03 | Disposition: A | Discharge status: Home / Self Care | Payer: 59 | Source: Ambulatory Visit | Attending: Pulmonary Disease | Admitting: Pulmonary Disease

## 2019-07-03 DIAGNOSIS — Z23 Encounter for immunization: Secondary | ICD-10-CM | POA: Diagnosis not present

## 2019-07-03 DIAGNOSIS — U071 COVID-19: Secondary | ICD-10-CM | POA: Insufficient documentation

## 2019-07-03 DIAGNOSIS — E669 Obesity, unspecified: Secondary | ICD-10-CM | POA: Diagnosis not present

## 2019-07-03 DIAGNOSIS — E785 Hyperlipidemia, unspecified: Secondary | ICD-10-CM

## 2019-07-03 MED ORDER — SODIUM CHLORIDE 0.9 % IV SOLN
700.0000 mg | Freq: Once | INTRAVENOUS | Status: AC
  Administered 2019-07-03: 700 mg via INTRAVENOUS
  Filled 2019-07-03: qty 20

## 2019-07-03 MED ORDER — ALBUTEROL SULFATE HFA 108 (90 BASE) MCG/ACT IN AERS: 2.0000 | INHALATION_SPRAY | Freq: Once | RESPIRATORY_TRACT | Status: DC | PRN

## 2019-07-03 MED ORDER — DIPHENHYDRAMINE HCL 50 MG/ML IJ SOLN: 50.0000 mg | Freq: Once | INTRAMUSCULAR | Status: DC | PRN

## 2019-07-03 MED ORDER — FAMOTIDINE IN NACL 20-0.9 MG/50ML-% IV SOLN: 20.0000 mg | Freq: Once | INTRAVENOUS | Status: DC | PRN

## 2019-07-03 MED ORDER — EPINEPHRINE 0.3 MG/0.3ML IJ SOAJ: 0.3000 mg | Freq: Once | INTRAMUSCULAR | Status: DC | PRN

## 2019-07-03 MED ORDER — SODIUM CHLORIDE 0.9 % IV SOLN: INTRAVENOUS | Status: DC | PRN

## 2019-07-03 MED ORDER — METHYLPREDNISOLONE SODIUM SUCC 125 MG IJ SOLR: 125.0000 mg | Freq: Once | INTRAMUSCULAR | Status: DC | PRN

## 2019-07-03 NOTE — Progress Notes (Signed)
  Diagnosis: COVID-19  Physician: Dr. Delford Field  Procedure: Covid Infusion Clinic Med: bamlanivimab infusion - Provided patient with bamlanimivab fact sheet for patients, parents and caregivers prior to infusion.  Complications: No immediate complications noted.  Discharge: Discharged home   Essie Hart 07/03/2019

## 2019-07-03 NOTE — Discharge Instructions (Signed)

## 2019-07-17 ENCOUNTER — Encounter: Payer: Self-pay | Admitting: Neurology

## 2019-07-17 ENCOUNTER — Ambulatory Visit: Payer: BLUE CROSS/BLUE SHIELD | Admitting: Neurology

## 2019-09-03 ENCOUNTER — Telehealth: Payer: Self-pay | Admitting: Neurology

## 2019-09-03 NOTE — Telephone Encounter (Signed)
In the sleep lab we do not issue mask therefore, we would not know the CPT code for the type of mask. This would need to be handled by his home health company.

## 2019-09-03 NOTE — Telephone Encounter (Signed)
I have asked [facility] to handle this issue. We do not handle this here.

## 2019-09-03 NOTE — Telephone Encounter (Signed)
Received this message from [facility]: "I have no idea what the rep is talking about, but we have never billed [facility]. We have not provided the pt anything since 2019 due to him having almost $700 in collections."  I will send this to our sleep lab again. Perhaps our sleep lab submitted auth for a cpap titration study and this is the mask the insurance is referring to.

## 2019-09-03 NOTE — Telephone Encounter (Signed)
Lets wait and see if [facility] calls back since either DME and sleep lab does not understand what they need.

## 2019-09-03 NOTE — Telephone Encounter (Signed)
Rep with [facility] called to request specific CPT code for mask states on their end it shows up as [place] characters and they are unable to determine code.

## 2021-01-15 ENCOUNTER — Telehealth: Payer: Self-pay | Admitting: Neurology

## 2021-01-15 NOTE — Telephone Encounter (Signed)
Almor@ [facility] Clinical Support Team has called to report Authorization [id number] Requested Codes A7030 1 unit and A7035 1 unit Date of Service 2021/02/08 to 2021/05/10 for both codes.  Almor stated this information will also will be faxed over in 2-5 days. Their [phone] fax#[phone] This is FYI for Pod 1, he has not asked for a call back

## 2021-01-15 NOTE — Telephone Encounter (Signed)
Pt has not been seen since 2020 and has no pending appt. Edwin Lucas was made aware at time of call of this.
